# Patient Record
Sex: Male | Born: 2000
Health system: Southern US, Community
[De-identification: ages and names within clinical notes are randomized; demographics above are authoritative.]

## PROBLEM LIST (undated history)

## (undated) DIAGNOSIS — S83281A Other tear of lateral meniscus, current injury, right knee, initial encounter: Secondary | ICD-10-CM

## (undated) DIAGNOSIS — Z789 Other specified health status: Secondary | ICD-10-CM

## (undated) HISTORY — PX: NO PAST SURGERIES: SHX2092

---
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
## 2018-01-04 DIAGNOSIS — S83281A Other tear of lateral meniscus, current injury, right knee, initial encounter: Secondary | ICD-10-CM | POA: Diagnosis not present

## 2018-01-09 DIAGNOSIS — M25561 Pain in right knee: Secondary | ICD-10-CM | POA: Diagnosis not present

## 2018-01-11 DIAGNOSIS — S83281D Other tear of lateral meniscus, current injury, right knee, subsequent encounter: Secondary | ICD-10-CM | POA: Diagnosis not present

## 2018-01-12 DIAGNOSIS — S83281D Other tear of lateral meniscus, current injury, right knee, subsequent encounter: Secondary | ICD-10-CM | POA: Diagnosis not present

## 2018-01-14 ENCOUNTER — Encounter (HOSPITAL_BASED_OUTPATIENT_CLINIC_OR_DEPARTMENT_OTHER): Payer: Self-pay | Admitting: *Deleted

## 2018-01-14 ENCOUNTER — Other Ambulatory Visit: Payer: Self-pay

## 2018-01-14 DIAGNOSIS — S83281A Other tear of lateral meniscus, current injury, right knee, initial encounter: Secondary | ICD-10-CM

## 2018-01-14 HISTORY — DX: Other tear of lateral meniscus, current injury, right knee, initial encounter: S83.281A

## 2018-01-14 NOTE — H&P (Signed)
Matthew Gill is an 17 y.o. male.   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 He comes to the office with concerns about his right knee. A little over two weeks ago he injured his right knee in a game. He felt a pop and had mild swelling but he was able to finish out the remainder of the basketball season with knee discomfort. No frank instability. No previous problems with his knee. It has not improved now despite relative rest. He comes in with his mother with concerns. Health by review is otherwise unremarkable. Normal growth and development for age   Past Medical History:  Diagnosis Date  . Medical history non-contributory     Past Surgical History:  Procedure Laterality Date  . NO PAST SURGERIES      Family History  Problem Relation Age of Onset  . Sudden death Neg Hx   . Heart attack Neg Hx    Social History:  reports that he has never smoked. He has never used smokeless tobacco. He reports that he does not drink alcohol or use drugs.  Allergies: No Known Allergies  No medications prior to admission.    No results found for this or any previous visit (from the past 48 hour(s)). No results found.  Review of Systems  Constitutional: Negative.   HENT: Negative.   Eyes: Negative.   Respiratory: Negative.   Cardiovascular: Negative.   Gastrointestinal: Negative.   Genitourinary: Negative.   Musculoskeletal: Negative.   Skin: Negative.   Neurological: Negative.   Endo/Heme/Allergies: Negative.   Psychiatric/Behavioral: Negative.     Height 6' (1.829 m), weight 74.8 kg (165 lb). Physical Exam  Constitutional: He is oriented to person, place, and time. He appears well-developed and well-nourished.  HENT:  Head: Normocephalic and atraumatic.  Mouth/Throat: Oropharynx is clear and moist.  Eyes: Pupils are equal, round, and reactive to light. Conjunctivae are normal.  Neck: Neck supple.   Cardiovascular: Normal rate.  Respiratory: Effort normal.  GI: Soft.  Genitourinary:  Genitourinary Comments: Not pertinent to current symptomatology therefore not examined.  Musculoskeletal:  Examination of his right knee shows a negative Lachman. No effusion. Full motion. Discreet tenderness to the lateral joint line. Positive McMurray. Neurovascularly intact. distally.  Neurological: He is alert and oriented to person, place, and time.  Skin: Skin is warm and dry.  Psychiatric: He has a normal mood and affect.     Assessment Principal Problem:   Acute lateral meniscus tear of right knee   Plan Matthew Gill comes into the office in follow up after having an MRI of his right knee.  By review, lateral meniscus tear.  ACL is intact.  I had a long discussion with he and his mother as to these MRI findings.  I discussed with them the merits of proceeding with right knee arthroscopy, debridement versus repair, subsequent post-op care, recovery and rehab all discussed at length with them.  Surgical consultation, per their request, this week with Dr. Thurston HoleWainer.  Post-op follow up with Dr. Thurston HoleWainer.    Matthew Selvy J Qasim Diveley, PA-C 01/14/2018, 9:36 AM

## 2018-01-15 ENCOUNTER — Encounter (HOSPITAL_BASED_OUTPATIENT_CLINIC_OR_DEPARTMENT_OTHER)
Admission: RE | Disposition: A | Discharge status: Home / Self Care | Payer: Self-pay | Source: Ambulatory Visit | Attending: Orthopedic Surgery

## 2018-01-15 ENCOUNTER — Ambulatory Visit (HOSPITAL_BASED_OUTPATIENT_CLINIC_OR_DEPARTMENT_OTHER): Payer: 59 | Admitting: Anesthesiology

## 2018-01-15 ENCOUNTER — Ambulatory Visit (HOSPITAL_BASED_OUTPATIENT_CLINIC_OR_DEPARTMENT_OTHER)
Admission: RE | Admit: 2018-01-15 | Discharge: 2018-01-15 | Disposition: A | Discharge status: Home / Self Care | Payer: 59 | Source: Ambulatory Visit | Attending: Orthopedic Surgery | Admitting: Orthopedic Surgery

## 2018-01-15 ENCOUNTER — Encounter (HOSPITAL_BASED_OUTPATIENT_CLINIC_OR_DEPARTMENT_OTHER): Payer: Self-pay | Admitting: *Deleted

## 2018-01-15 DIAGNOSIS — S83281A Other tear of lateral meniscus, current injury, right knee, initial encounter: Secondary | ICD-10-CM | POA: Diagnosis not present

## 2018-01-15 DIAGNOSIS — Y9367 Activity, basketball: Secondary | ICD-10-CM | POA: Insufficient documentation

## 2018-01-15 DIAGNOSIS — G8918 Other acute postprocedural pain: Secondary | ICD-10-CM | POA: Diagnosis not present

## 2018-01-15 DIAGNOSIS — X58XXXA Exposure to other specified factors, initial encounter: Secondary | ICD-10-CM | POA: Insufficient documentation

## 2018-01-15 HISTORY — DX: Other tear of lateral meniscus, current injury, right knee, initial encounter: S83.281A

## 2018-01-15 HISTORY — DX: Other specified health status: Z78.9

## 2018-01-15 HISTORY — PX: KNEE ARTHROSCOPY WITH LATERAL MENISECTOMY: SHX6193

## 2018-01-15 SURGERY — ARTHROSCOPY, KNEE, WITH LATERAL MENISCECTOMY
Anesthesia: General | Site: Knee | Laterality: Right

## 2018-01-15 MED ORDER — FENTANYL CITRATE (PF) 100 MCG/2ML IJ SOLN
50.0000 ug | INTRAMUSCULAR | Status: DC | PRN
  Administered 2018-01-15: 100 ug via INTRAVENOUS

## 2018-01-15 MED ORDER — GLYCOPYRROLATE 0.2 MG/ML IJ SOLN
INTRAMUSCULAR | Status: DC | PRN
  Administered 2018-01-15: 0.2 mg via INTRAVENOUS

## 2018-01-15 MED ORDER — NEOSTIGMINE METHYLSULFATE 5 MG/5ML IV SOSY
PREFILLED_SYRINGE | INTRAVENOUS | Status: AC
  Filled 2018-01-15: qty 5

## 2018-01-15 MED ORDER — LIDOCAINE HCL (CARDIAC) 20 MG/ML IV SOLN
INTRAVENOUS | Status: AC
Start: 2018-01-15 — End: 2018-01-15
  Filled 2018-01-15: qty 5

## 2018-01-15 MED ORDER — ONDANSETRON HCL 4 MG/2ML IJ SOLN
INTRAMUSCULAR | Status: DC | PRN
  Administered 2018-01-15: 4 mg via INTRAVENOUS

## 2018-01-15 MED ORDER — POVIDONE-IODINE 7.5 % EX SOLN: Freq: Once | CUTANEOUS | Status: DC

## 2018-01-15 MED ORDER — CHLORHEXIDINE GLUCONATE 4 % EX LIQD: 60.0000 mL | Freq: Once | CUTANEOUS | Status: DC

## 2018-01-15 MED ORDER — ROCURONIUM BROMIDE 10 MG/ML (PF) SYRINGE
PREFILLED_SYRINGE | INTRAVENOUS | Status: AC
  Filled 2018-01-15: qty 5

## 2018-01-15 MED ORDER — SCOPOLAMINE 1 MG/3DAYS TD PT72: 1.0000 | MEDICATED_PATCH | Freq: Once | TRANSDERMAL | Status: DC | PRN

## 2018-01-15 MED ORDER — KETOROLAC TROMETHAMINE 30 MG/ML IJ SOLN: 30.0000 mg | Freq: Once | INTRAMUSCULAR | Status: DC | PRN

## 2018-01-15 MED ORDER — LACTATED RINGERS IV SOLN
INTRAVENOUS | Status: DC
  Administered 2018-01-15: 10:00:00 via INTRAVENOUS

## 2018-01-15 MED ORDER — HYDROCODONE-ACETAMINOPHEN 5-325 MG PO TABS: 1.0000 | ORAL_TABLET | ORAL | 0 refills | Status: DC | PRN

## 2018-01-15 MED ORDER — FENTANYL CITRATE (PF) 100 MCG/2ML IJ SOLN
INTRAMUSCULAR | Status: AC
  Filled 2018-01-15: qty 2

## 2018-01-15 MED ORDER — OXYCODONE HCL 5 MG/5ML PO SOLN: 5.0000 mg | Freq: Once | ORAL | Status: DC | PRN

## 2018-01-15 MED ORDER — BUPIVACAINE-EPINEPHRINE (PF) 0.25% -1:200000 IJ SOLN
INTRAMUSCULAR | Status: AC
  Filled 2018-01-15: qty 30

## 2018-01-15 MED ORDER — PROPOFOL 10 MG/ML IV BOLUS
INTRAVENOUS | Status: AC
  Filled 2018-01-15: qty 40

## 2018-01-15 MED ORDER — LIDOCAINE HCL (CARDIAC) 20 MG/ML IV SOLN
INTRAVENOUS | Status: DC | PRN
  Administered 2018-01-15: 80 mg via INTRAVENOUS

## 2018-01-15 MED ORDER — CEFAZOLIN SODIUM-DEXTROSE 2-4 GM/100ML-% IV SOLN: 2.0000 g | INTRAVENOUS | Status: DC

## 2018-01-15 MED ORDER — MIDAZOLAM HCL 2 MG/2ML IJ SOLN
INTRAMUSCULAR | Status: AC
  Filled 2018-01-15: qty 2

## 2018-01-15 MED ORDER — FENTANYL CITRATE (PF) 100 MCG/2ML IJ SOLN
INTRAMUSCULAR | Status: AC
Start: 2018-01-15 — End: 2018-01-15
  Filled 2018-01-15: qty 2

## 2018-01-15 MED ORDER — PROMETHAZINE HCL 25 MG/ML IJ SOLN: 6.2500 mg | INTRAMUSCULAR | Status: DC | PRN

## 2018-01-15 MED ORDER — BUPIVACAINE-EPINEPHRINE (PF) 0.5% -1:200000 IJ SOLN
INTRAMUSCULAR | Status: DC | PRN
  Administered 2018-01-15: 30 mL

## 2018-01-15 MED ORDER — FENTANYL CITRATE (PF) 100 MCG/2ML IJ SOLN: 25.0000 ug | INTRAMUSCULAR | Status: DC | PRN

## 2018-01-15 MED ORDER — CEFAZOLIN SODIUM-DEXTROSE 2-3 GM-%(50ML) IV SOLR
INTRAVENOUS | Status: DC | PRN
  Administered 2018-01-15: 2 g via INTRAVENOUS

## 2018-01-15 MED ORDER — OXYCODONE HCL 5 MG PO TABS: 5.0000 mg | ORAL_TABLET | Freq: Once | ORAL | Status: DC | PRN

## 2018-01-15 MED ORDER — PROPOFOL 10 MG/ML IV BOLUS
INTRAVENOUS | Status: DC | PRN
  Administered 2018-01-15: 150 mg via INTRAVENOUS
  Administered 2018-01-15: 50 mg via INTRAVENOUS

## 2018-01-15 MED ORDER — DEXAMETHASONE SODIUM PHOSPHATE 10 MG/ML IJ SOLN
INTRAMUSCULAR | Status: DC | PRN
  Administered 2018-01-15: 10 mg via INTRAVENOUS

## 2018-01-15 MED ORDER — MIDAZOLAM HCL 2 MG/2ML IJ SOLN
1.0000 mg | INTRAMUSCULAR | Status: DC | PRN
  Administered 2018-01-15: 2 mg via INTRAVENOUS

## 2018-01-15 MED ORDER — LIDOCAINE-EPINEPHRINE (PF) 1.5 %-1:200000 IJ SOLN
INTRAMUSCULAR | Status: DC | PRN
  Administered 2018-01-15: 20 mL

## 2018-01-15 MED ORDER — CEFAZOLIN SODIUM-DEXTROSE 2-4 GM/100ML-% IV SOLN
INTRAVENOUS | Status: AC
  Filled 2018-01-15: qty 100

## 2018-01-15 MED ORDER — LACTATED RINGERS IV SOLN
INTRAVENOUS | Status: DC
  Administered 2018-01-15: 09:00:00 via INTRAVENOUS

## 2018-01-15 MED FILL — HYDROCODON-APAP 5-325: 5-325 | 4 days supply | Qty: 20 | Fill #0

## 2018-01-15 SURGICAL SUPPLY — 44 items
BANDAGE ACE 6X5 VEL STRL LF (GAUZE/BANDAGES/DRESSINGS) ×2 IMPLANT
BLADE CUDA GRT WHITE 3.5 (BLADE) IMPLANT
BLADE CUTTER GATOR 3.5 (BLADE) ×2 IMPLANT
BLADE GREAT WHITE 4.2 (BLADE) IMPLANT
BLADE SURG 15 STRL LF DISP TIS (BLADE) IMPLANT
BLADE SURG 15 STRL SS (BLADE)
BNDG COHESIVE 4X5 TAN STRL (GAUZE/BANDAGES/DRESSINGS) IMPLANT
DRAPE ARTHROSCOPY W/POUCH 90 (DRAPES) ×2 IMPLANT
DURAPREP 26ML APPLICATOR (WOUND CARE) ×2 IMPLANT
GAUZE SPONGE 4X4 12PLY STRL (GAUZE/BANDAGES/DRESSINGS) ×2 IMPLANT
GAUZE XEROFORM 1X8 LF (GAUZE/BANDAGES/DRESSINGS) ×2 IMPLANT
GLOVE BIO SURGEON STRL SZ 6.5 (GLOVE) ×2 IMPLANT
GLOVE BIO SURGEON STRL SZ7 (GLOVE) ×2 IMPLANT
GLOVE BIOGEL PI IND STRL 7.0 (GLOVE) ×3 IMPLANT
GLOVE BIOGEL PI IND STRL 7.5 (GLOVE) ×1 IMPLANT
GLOVE BIOGEL PI INDICATOR 7.0 (GLOVE) ×3
GLOVE BIOGEL PI INDICATOR 7.5 (GLOVE) ×1
GLOVE SS BIOGEL STRL SZ 7.5 (GLOVE) ×1 IMPLANT
GLOVE SUPERSENSE BIOGEL SZ 7.5 (GLOVE) ×1
GOWN STRL REUS W/ TWL LRG LVL3 (GOWN DISPOSABLE) ×2 IMPLANT
GOWN STRL REUS W/ TWL XL LVL3 (GOWN DISPOSABLE) ×1 IMPLANT
GOWN STRL REUS W/TWL LRG LVL3 (GOWN DISPOSABLE) ×2
GOWN STRL REUS W/TWL XL LVL3 (GOWN DISPOSABLE) ×1
HOLDER KNEE FOAM BLUE (MISCELLANEOUS) ×2 IMPLANT
KNEE WRAP E Z 3 GEL PACK (MISCELLANEOUS) ×2 IMPLANT
MANIFOLD NEPTUNE II (INSTRUMENTS) ×2 IMPLANT
NDL SAFETY ECLIPSE 18X1.5 (NEEDLE) ×1 IMPLANT
NEEDLE HYPO 18GX1.5 SHARP (NEEDLE) ×1
NEEDLE HYPO 22GX1.5 SAFETY (NEEDLE) IMPLANT
PACK ARTHROSCOPY DSU (CUSTOM PROCEDURE TRAY) ×2 IMPLANT
PACK BASIN DAY SURGERY FS (CUSTOM PROCEDURE TRAY) ×2 IMPLANT
PAD ALCOHOL SWAB (MISCELLANEOUS) IMPLANT
SUCTION FRAZIER HANDLE 10FR (MISCELLANEOUS)
SUCTION TUBE FRAZIER 10FR DISP (MISCELLANEOUS) IMPLANT
SUT ETHILON 4 0 PS 2 18 (SUTURE) ×2 IMPLANT
SUT PROLENE 3 0 PS 2 (SUTURE) IMPLANT
SUT VIC AB 3-0 PS1 18 (SUTURE)
SUT VIC AB 3-0 PS1 18XBRD (SUTURE) IMPLANT
SYR 20CC LL (SYRINGE) IMPLANT
SYR 5ML LL (SYRINGE) ×2 IMPLANT
TOWEL OR 17X24 6PK STRL BLUE (TOWEL DISPOSABLE) ×2 IMPLANT
TUBING ARTHRO INFLOW-ONLY STRL (TUBING) ×2 IMPLANT
WAND STAR VAC 90 (SURGICAL WAND) IMPLANT
WATER STERILE IRR 1000ML POUR (IV SOLUTION) ×2 IMPLANT

## 2018-01-15 NOTE — Anesthesia Postprocedure Evaluation (Signed)
Anesthesia Post Note  Patient: Matthew Gill  Procedure(s) Performed: RIGHT KNEE ARTHROSCOPY WITH PARTIAL LATERAL MENISECTOMY (Right Knee)     Patient location during evaluation: PACU Anesthesia Type: General Level of consciousness: awake and alert Pain management: pain level controlled Vital Signs Assessment: post-procedure vital signs reviewed and stable Respiratory status: spontaneous breathing, nonlabored ventilation, respiratory function stable and patient connected to nasal cannula oxygen Cardiovascular status: blood pressure returned to baseline and stable Postop Assessment: no apparent nausea or vomiting Anesthetic complications: no    Last Vitals:  Vitals:   01/15/18 1130 01/15/18 1200  BP: (!) 107/58 107/67  Pulse: (!) 52 (!) 101  Resp: 12 16  Temp:  36.7 C  SpO2: 100% 97%    Last Pain:  Vitals:   01/15/18 1200  TempSrc:   PainSc: 0-No pain                 Alverta Caccamo S

## 2018-01-15 NOTE — Interval H&P Note (Signed)
History and Physical Interval Note:  01/15/2018 9:49 AM  Matthew Gill  has presented today for surgery, with the diagnosis of RIGHT KNEE TEAR OF LATERAL MENISCUS  The various methods of treatment have been discussed with the patient and family. After consideration of risks, benefits and other options for treatment, the patient has consented to  Procedure(s): RIGHT KNEE ARTHROSCOPY WITH LATERAL MENISECTOMY VERSES REPAIR,CLAIRE WITH LINVATEC POSSIBLE LATERAL MENISCUS REPAIR (Right) as a surgical intervention .  The patient's history has been reviewed, patient examined, no change in status, stable for surgery.  I have reviewed the patient's chart and labs.  Questions were answered to the patient's satisfaction.     Nilda Simmerobert A Shanah Guimaraes

## 2018-01-15 NOTE — Op Note (Signed)
NAMOletta Lamas:  Botting, Avanish                ACCOUNT NO.:  [id number]  MEDICAL RECORD NO.:  [id number]  LOCATION:                                 FACILITY:  PHYSICIAN:  Elana Almobert A. Thurston HoleWainer, M.D.      DATE OF BIRTH:  DATE OF PROCEDURE:  01/15/2018 DATE OF DISCHARGE:                              OPERATIVE REPORT   PREOPERATIVE DIAGNOSIS:  Right knee acute traumatic lateral meniscus tear.  POSTOPERATIVE DIAGNOSIS:  Right knee acute traumatic lateral meniscus tear.  PROCEDURE:  Right knee EUA, followed by arthroscopic partial lateral meniscectomy.  SURGEON:  Elana Almobert A. Thurston HoleWainer, M.D.  ASSISTANT:  Kirstin Shepperson, PA-C.  ANESTHESIA:  General.  OPERATIVE TIME:  40 minutes.  COMPLICATIONS:  None.  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  Exam and MRIs revealed a lateral meniscus tear.  He has failed conservative care and is now to undergo arthroscopy.  DESCRIPTION:  Matthew Gill was brought in the operating room on January 15, 2018 after knee block was placed in the holding room by Anesthesia.  He was placed on operative table in supine position.  He received antibiotics preoperatively for prophylaxis.  After being placed under general anesthesia, his right knee was examined.  He had full range of motion. Knee was stable ligamentous exam with normal patellar tracking.  Right leg was prepped using sterile DuraPrep and draped using sterile technique.  Time-out procedure was called and the correct right knee identified.  Initially, through an anterolateral portal, the arthroscope with a pump attached was placed into an anteromedial portal and arthroscopic probe was placed.  On initial inspection of medial compartment, the articular cartilage was normal.  Medial meniscus was normal.  Intercondylar notch inspected.  Anterior cruciate ligament appeared normal with 2-3 mm of laxity with  a firm endpoint.  PCL was intact and stable.  Lateral compartment was inspected.  The articular cartilage showed a small grade 3 chondral injury on the lateral femoral condyle, which was debrided.  The rest of the lateral compartment was intact.  Lateral meniscus showed a split tear in the posterolateral corner, which was non-repairable.  This split went all the way to the periphery and the posterior and anterior components of this tear were carefully debrided back to a stable rim.  Approximately 30% of the posterolateral corner was resected back to a stable rim.  The rest of meniscus was intact.  Patellofemoral joint articular cartilage was intact.  The patella tracked normally.  Medial and lateral gutters were free of pathology.  At this point, felt that all pathology have been satisfactorily addressed.  The instruments were removed.  Portals closed with 3-0 nylon suture.  Sterile dressings were applied.  The patient awakened, taken to recovery room in stable condition.  FOLLOWUP CARE:  Matthew Gill will be followed as an outpatient on Norco for pain.  Seen back in office in a week for sutures out and followup.     Matthew Gill A. Thurston HoleWainer, M.D.   ______________________________ Elana Almobert A. Thurston HoleWainer, M.D.    RAW/MEDQ  D:  01/15/2018  T:  01/15/2018  Job:  [id number]

## 2018-01-15 NOTE — Anesthesia Procedure Notes (Signed)
Procedure Name: LMA Insertion Performed by: Damek Ende M, CRNA Pre-anesthesia Checklist: Patient identified, Emergency Drugs available, Suction available, Patient being monitored and Timeout performed Patient Re-evaluated:Patient Re-evaluated prior to induction Oxygen Delivery Method: Circle system utilized Preoxygenation: Pre-oxygenation with 100% oxygen Induction Type: IV induction Ventilation: Mask ventilation without difficulty LMA: LMA inserted LMA Size: 4.0 Tube type: Oral Number of attempts: 1 Placement Confirmation: positive ETCO2,  CO2 detector and breath sounds checked- equal and bilateral Tube secured with: Tape Dental Injury: Teeth and Oropharynx as per pre-operative assessment        

## 2018-01-15 NOTE — Anesthesia Preprocedure Evaluation (Signed)
Anesthesia Evaluation  Patient identified by MRN, date of birth, ID band Patient awake    Reviewed: Allergy & Precautions, NPO status , Patient's Chart, lab work & pertinent test results  Airway Mallampati: II  TM Distance: >3 FB Neck ROM: Full    Dental no notable dental hx.    Pulmonary neg pulmonary ROS,    Pulmonary exam normal breath sounds clear to auscultation       Cardiovascular negative cardio ROS Normal cardiovascular exam Rhythm:Regular Rate:Normal     Neuro/Psych negative neurological ROS  negative psych ROS   GI/Hepatic negative GI ROS, Neg liver ROS,   Endo/Other  negative endocrine ROS  Renal/GU negative Renal ROS  negative genitourinary   Musculoskeletal negative musculoskeletal ROS (+)   Abdominal   Peds negative pediatric ROS (+)  Hematology negative hematology ROS (+)   Anesthesia Other Findings   Reproductive/Obstetrics negative OB ROS                             Anesthesia Physical Anesthesia Plan  ASA: I  Anesthesia Plan: General   Post-op Pain Management:    Induction: Intravenous  PONV Risk Score and Plan: 2 and Ondansetron, Dexamethasone and Treatment may vary due to age or medical condition  Airway Management Planned: LMA  Additional Equipment:   Intra-op Plan:   Post-operative Plan: Extubation in OR  Informed Consent: I have reviewed the patients History and Physical, chart, labs and discussed the procedure including the risks, benefits and alternatives for the proposed anesthesia with the patient or authorized representative who has indicated his/her understanding and acceptance.     Dental advisory given  Plan Discussed with: CRNA and Surgeon  Anesthesia Plan Comments:         Anesthesia Quick Evaluation  

## 2018-01-15 NOTE — Transfer of Care (Signed)
Immediate Anesthesia Transfer of Care Note  Patient: Matthew Gill  Procedure(s) Performed: RIGHT KNEE ARTHROSCOPY WITH PARTIAL LATERAL MENISECTOMY (Right Knee)  Patient Location: PACU  Anesthesia Type:General  Level of Consciousness: awake, alert  and oriented  Airway & Oxygen Therapy: Patient Spontanous Breathing and Patient connected to face mask oxygen  Post-op Assessment: Report given to RN and Post -op Vital signs reviewed and stable  Post vital signs: Reviewed and stable  Last Vitals:  Vitals Value Taken Time  BP 90/39 01/15/2018 10:38 AM  Temp    Pulse 72 01/15/2018 10:40 AM  Resp 15 01/15/2018 10:40 AM  SpO2 100 % 01/15/2018 10:40 AM  Vitals shown include unvalidated device data.  Last Pain:  Vitals:   01/15/18 0854  TempSrc: Oral  PainSc: 6          Complications: No apparent anesthesia complications

## 2018-01-15 NOTE — Discharge Instructions (Signed)

## 2018-01-15 NOTE — Anesthesia Procedure Notes (Signed)
Anesthesia Regional Block: Knee block   Pre-Anesthetic Checklist: ,, timeout performed, Correct Patient, Correct Site, Correct Laterality, Correct Procedure, Correct Position, site marked, Risks and benefits discussed,  Surgical consent,  Pre-op evaluation,  At surgeon's request and post-op pain management  Laterality: Right  Prep: chloraprep       Needles:  Injection technique: Single-shot  Needle Type: Other      Needle Gauge: 18     Additional Needles:   Narrative:  Start time: 01/15/2018 9:45 AM End time: 01/15/2018 9:52 AM Injection made incrementally with aspirations every 5 mL.  Performed by: Personally  Anesthesiologist: Eilene Ghaziose, Jersi Mcmaster, MD  Additional Notes: Patient tolerated the procedure well without complications  Intra-articular knee block with 30 cc 0.5% bupiv, and 20cc 1.5%lido with epi 1:200K

## 2018-01-18 ENCOUNTER — Encounter (HOSPITAL_BASED_OUTPATIENT_CLINIC_OR_DEPARTMENT_OTHER): Payer: Self-pay | Admitting: Orthopedic Surgery

## 2018-01-25 DIAGNOSIS — S83281D Other tear of lateral meniscus, current injury, right knee, subsequent encounter: Secondary | ICD-10-CM | POA: Diagnosis not present

## 2018-02-08 ENCOUNTER — Other Ambulatory Visit: Payer: Self-pay

## 2018-02-08 ENCOUNTER — Encounter: Payer: Self-pay | Admitting: Physical Therapy

## 2018-02-08 ENCOUNTER — Ambulatory Visit: Payer: 59 | Attending: Orthopedic Surgery | Admitting: Physical Therapy

## 2018-02-08 DIAGNOSIS — M6281 Muscle weakness (generalized): Secondary | ICD-10-CM | POA: Insufficient documentation

## 2018-02-08 DIAGNOSIS — R29898 Other symptoms and signs involving the musculoskeletal system: Secondary | ICD-10-CM | POA: Diagnosis not present

## 2018-02-08 DIAGNOSIS — M25561 Pain in right knee: Secondary | ICD-10-CM | POA: Insufficient documentation

## 2018-02-08 NOTE — Therapy (Signed)
[facility] [address]  [address] [place], [place], [address] Phone: [phone]   Fax:  [phone]  Physical Therapy Evaluation  Patient Details  Name: Matthew Gill MRN: [id number] Date of Birth: 11/12/2000 Referring Provider: Dr. Thurston Hole   Encounter Date: 02/08/2018  PT End of Session - 02/08/18 0925    Visit Number  1    Number of Visits  12    Date for PT Re-Evaluation  03/22/18    PT Start Time  0839    PT Stop Time  0920    PT Time Calculation (min)  41 min    Activity Tolerance  Patient tolerated treatment well    Behavior During Therapy  [facility] for tasks assessed/performed       Past Medical History:  Diagnosis Date  . Acute lateral meniscus tear of right knee 01/14/2018  . Medical history non-contributory     Past Surgical History:  Procedure Laterality Date  . KNEE ARTHROSCOPY WITH LATERAL MENISECTOMY Right 01/15/2018   Procedure: RIGHT KNEE ARTHROSCOPY WITH PARTIAL LATERAL MENISECTOMY;  Surgeon: Salvatore Marvel, MD;  Location: [facility];  Service: Orthopedics;  Laterality: Right;  . NO PAST SURGERIES      There were no vitals filed for this visit.   Subjective Assessment - 02/08/18 0840    Subjective  patient reporting pain and swelling after basketball. Had menisectomy - feeling better. No running, jumping, squatting for 2 months. Going to [facility] in the fall to play basketball.     Patient is accompained by:  Family member    Diagnostic tests  MRI: R lateral meniscus tear    Patient Stated Goals  return to playing ball    Currently in Pain?  No/denies         [facility] PT Assessment - 02/08/18 0842      Assessment   Medical Diagnosis  R knee scope + partial lateral meniscectomy    Referring Provider  Dr. Thurston Hole    Onset Date/Surgical Date  01/15/18    Next MD Visit  02/15/18    Prior Therapy  no      Precautions   Precautions  None      Restrictions   Weight Bearing Restrictions  No       Balance Screen   Has the patient fallen in the past 6 months  No    Has the patient had a decrease in activity level because of a fear of falling?   No    Is the patient reluctant to leave their home because of a fear of falling?   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      Cognition   Overall Cognitive Status  Within Functional Limits for tasks assessed      Sensation   Light Touch  Appears Intact      Coordination   Gross Motor Movements are Fluid and Coordinated  Yes      Posture/Postural Control   Posture/Postural Control  Postural limitations    Postural Limitations  Rounded Shoulders;Forward head      ROM / Strength   AROM / PROM / Strength  AROM;Strength      AROM   AROM Assessment Site  Knee    Right/Left Knee  Right    Right Knee Extension  -1    Right Knee Flexion  130      Strength   Strength Assessment Site  Hip;Knee    Right/Left Hip  Right    Right Hip Flexion  4/5    Right Hip ABduction  4-/5    Right/Left Knee  Right    Right Knee Flexion  4/5    Right Knee Extension  4+/5      Flexibility   Soft Tissue Assessment /Muscle Length  yes    Hamstrings  R hamstring moderate tightness      Palpation   Patella mobility  good mobility in all planes    Palpation comment  diffusely non-tender                Objective measurements completed on examination: See above findings.      [facility] Adult PT Treatment/Exercise - 02/08/18 0842      Exercises   Exercises  Knee/Hip      Knee/Hip Exercises: Stretches   Passive Hamstring Stretch  Right;3 reps;30 seconds    Gastroc Stretch  Right;3 reps;30 seconds      Knee/Hip Exercises: Aerobic   Stationary Bike  L1 x 5 min - some onset of anterior  knee pain      Knee/Hip Exercises: Supine   Single Leg Bridge  Right;10 reps    Straight Leg Raises  Right;10 reps    Straight Leg Raise with External Rotation  Right;10 reps      Knee/Hip Exercises: Sidelying   Hip ABduction  Right;10 reps      Knee/Hip Exercises: Prone   Hamstring Curl  10 reps;5 seconds    Hamstring Curl Limitations  eccentric lowering    Hip Extension  Right;10 reps             PT Education - 02/08/18 0924    Education provided  Yes    Education Details  exam findings, POC, HEP    Person(s) Educated  Patient    Methods  Explanation;Demonstration;Handout    Comprehension  Verbalized understanding;Returned demonstration          PT Long Term Goals - 02/08/18 1023      PT LONG TERM GOAL #1   Title  patient to be independent with advanced HEP    Status  New    Target Date  03/22/18      PT LONG TERM GOAL #2   Title  patient to improve R hip and knee strength to 5/5 for improved function    Status  New    Target Date  03/22/18      PT LONG TERM GOAL #3   Title  patient to demonstrate eccentric step down of R LE without instability or pain    Status  New    Target Date  03/22/18      PT LONG TERM GOAL #4   Title  patient to demonstrate good running/jumping/landing mechanics without instability - as allowed per MD    Status  New    Target Date  03/22/18             Plan - 02/08/18 0925    Clinical Impression Statement  Laurin is an 17 y/o male presenting to OPPT today s/p R knee scope + partial lateral meniscectomy on 01/15/18. 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 Patient today with good gait mechanics and AROM - primary deficits seen in strength at R hip and knee. Patient given initial HEP for gentle stretching and strengthening with good tolerance and carryover. Patient to benefit from skilled PT intervention to address strength deficits and to progress towards return to sport for reduced risk of re-injury.      Clinical Presentation  Stable    Clinical Decision Making  Low    Rehab Potential  Excellent    PT Frequency  2x / week    PT Duration  6 weeks    PT Treatment/Interventions  ADLs/Self Care Home Management;Cryotherapy;Electrical Stimulation;Iontophoresis /ml Dexamethasone;Moist Heat;Therapeutic exercise;Therapeutic activities;Functional mobility training;Balance training;Neuromuscular re-education;Patient/family education;Manual techniques;Vasopneumatic Device;Taping;Dry needling;Passive range of motion    PT Next Visit Plan  SLOW progress rehab - no running, jumping, squatting for 2 months - per MD    Consulted and Agree with Plan of Care  Patient;Family member/caregiver    Family Member Consulted  mother       Patient will benefit from skilled therapeutic intervention in order to improve the following deficits and impairments:  Pain, Decreased strength, Decreased mobility, Decreased activity tolerance  Visit Diagnosis: Acute pain of right knee  Other symptoms and signs involving the musculoskeletal system  Muscle weakness (generalized)     Problem List Patient Active Problem List   Diagnosis Date Noted  . Acute lateral meniscus tear of right knee 01/14/2018  . Sports physical 06/10/2017    Kipp Laurence, PT, DPT 02/08/18 10:26 AM   [facility] [address]  [address] [place], [place], [address] Phone: [phone]   Fax:  [phone]  Name: MANRAJ YEO MRN: [id number] Date of Birth: 07/31/2001

## 2018-02-08 NOTE — Patient Instructions (Addendum)
Hamstring Step 2   Left foot relaxed, knee straight, other leg bent, foot flat. Raise straight leg further upward to maximal range. Hold _30__ seconds. Relax leg completely down. Repeat _3__ times.  Strengthening: Straight Leg Raise (Phase 1)   Tighten muscles on front of right thigh, then lift leg _~8___ inches from surface, keeping knee locked.  Repeat __15__ times per set. Do ___2_ sets per session.   Straight Leg Raise: With External Leg Rotation   Lie on back with right leg straight, opposite leg bent. Rotate straight leg out and lift ___~8_ inches. Repeat __15__ times per set. Do __2__ sets per session.   Bridging (Single Leg)   Lie on back with feet shoulder width apart and left leg straight. Lift hips toward the ceiling while keeping leg straight. Hold __2-3__ seconds. Repeat _15___ times. Do __2__ sessions per day.  ABDUCTION: Side-Lying (Active)   Lie on left side, top leg straight. Raise top leg as far as possible. Use _0__ lbs. Complete 2___ sets of _15__ repetitions.   HIP: Extension / KNEE: Flexion - Prone   Bend knee, squeeze glutes. Raise leg up. _15__ reps per set, _2__ sets per day  ANKLE: Dorsiflexion, Step Unilateral   Stand on step, hang one heel off back of step. Hold _30__ seconds. _3__ reps per set, __2_ sets per day

## 2018-02-10 ENCOUNTER — Ambulatory Visit: Payer: 59 | Attending: Orthopedic Surgery | Admitting: Physical Therapy

## 2018-02-10 ENCOUNTER — Encounter: Payer: Self-pay | Admitting: Physical Therapy

## 2018-02-10 DIAGNOSIS — M25561 Pain in right knee: Secondary | ICD-10-CM | POA: Insufficient documentation

## 2018-02-10 DIAGNOSIS — M6281 Muscle weakness (generalized): Secondary | ICD-10-CM | POA: Insufficient documentation

## 2018-02-10 DIAGNOSIS — R29898 Other symptoms and signs involving the musculoskeletal system: Secondary | ICD-10-CM | POA: Insufficient documentation

## 2018-02-10 NOTE — Therapy (Signed)
[facility] [address]  [address] [place], [place], [address] Phone: [phone]   Fax:  [phone]  Physical Therapy Treatment  Patient Details  Name: Matthew Gill MRN: [id number] Date of Birth: 12/18/2000 Referring Provider: Dr. Thurston Gill   Encounter Date: 02/10/2018  PT End of Session - 02/10/18 0809    Visit Number  2    Number of Visits  12    Date for PT Re-Evaluation  03/22/18    PT Start Time  0808    PT Stop Time  0850    PT Time Calculation (min)  42 min    Activity Tolerance  Patient tolerated treatment well    Behavior During Therapy  [facility] for tasks assessed/performed       Past Medical History:  Diagnosis Date  . Acute lateral meniscus tear of right knee 01/14/2018  . Medical history non-contributory     Past Surgical History:  Procedure Laterality Date  . KNEE ARTHROSCOPY WITH LATERAL MENISECTOMY Right 01/15/2018   Procedure: RIGHT KNEE ARTHROSCOPY WITH PARTIAL LATERAL MENISECTOMY;  Surgeon: Matthew Marvel, MD;  Location: [facility];  Service: Orthopedics;  Laterality: Right;  . NO PAST SURGERIES      There were no vitals filed for this visit.  Subjective Assessment - 02/10/18 0809    Subjective  doing well - has been doing HEP without pain    Diagnostic tests  MRI: R lateral meniscus tear    Patient Stated Goals  return to playing ball    Currently in Pain?  No/denies                       [facility] Adult PT Treatment/Exercise - 02/10/18 0001      Knee/Hip Exercises: Stretches   Other Knee/Hip Stretches  foam roller to B quad - 3 way x 1 min each      Knee/Hip Exercises: Aerobic   Stationary Bike  L2 x 6 min      Knee/Hip Exercises: Machines for Strengthening   Cybex Knee Flexion  B LE - 20# x 10; B con/R ecc - 15# x 10      Knee/Hip Exercises: Standing   Hip Flexion  Stengthening;Right;15 reps;Knee straight;Limitations    Hip Flexion Limitations  red tband - 1  pole A    Hip Abduction  Stengthening;Right;15 reps;Knee straight;Limitations    Abduction Limitations  red tband - 1 pole A    Hip Extension  Stengthening;Right;15 reps;Knee straight    Extension Limitations  red tband - 1 pole A    Step Down  Right;2 sets;15 reps;Hand Hold: 2;Step Height: 6"    Other Standing Knee Exercises  B side stepping - red tband at forefoot x 30 feet each direction       Knee/Hip Exercises: Seated   Other Seated Knee/Hip Exercises  R LE - fitter - 1 blue/1black x 15      Knee/Hip Exercises: Supine   Bridges with [name]  Strengthening;15 reps + R LAQ    Straight Leg Raises  Strengthening;Right;15 reps 2#    Straight Leg Raise with External Rotation  Strengthening;Right;15 reps 2#    Other Supine Knee/Hip Exercises  HS bridge + HS curl x 15 reps      Knee/Hip Exercises: Sidelying   Hip ABduction  Strengthening;Right;15 reps 2#                  PT Long Term  Goals - 02/10/18 0809      PT LONG TERM GOAL #1   Title  patient to be independent with advanced HEP    Status  On-going      PT LONG TERM GOAL #2   Title  patient to improve R hip and knee strength to 5/5 for improved function    Status  On-going      PT LONG TERM GOAL #3   Title  patient to demonstrate eccentric step down of R LE without instability or pain    Status  On-going      PT LONG TERM GOAL #4   Title  patient to demonstrate good running/jumping/landing mechanics without instability - as allowed per MD    Status  On-going            Plan - 02/10/18 0810    Clinical Impression Statement  Matthew Gill doing well - reports good compliance with initial HEP. Patient today tolerable to all strengthening based activities without. Keeping session relatively low level per MD request. Does demonstrate expected fatigue after each set/activity, and without pain. Updated HEP to include foam rolling.     PT Treatment/Interventions  ADLs/Self Care Home Management;Cryotherapy;Electrical  Stimulation;Iontophoresis /ml Dexamethasone;Moist Heat;Therapeutic exercise;Therapeutic activities;Functional mobility training;Balance training;Neuromuscular re-education;Patient/family education;Manual techniques;Vasopneumatic Device;Taping;Dry needling;Passive range of motion    PT Next Visit Plan  SLOW progress rehab - no running, jumping, squatting for 2 months - per MD    Consulted and Agree with Plan of Care  Patient       Patient will benefit from skilled therapeutic intervention in order to improve the following deficits and impairments:  Pain, Decreased strength, Decreased mobility, Decreased activity tolerance  Visit Diagnosis: Acute pain of right knee  Other symptoms and signs involving the musculoskeletal system  Muscle weakness (generalized)     Problem List Patient Active Problem List   Diagnosis Date Noted  . Acute lateral meniscus tear of right knee 01/14/2018  . Sports physical 06/10/2017     Kipp Laurence, PT, DPT 02/10/18 8:55 AM   [facility] [address]  [address] [place], [place], [address] Phone: [phone]   Fax:  [phone]  Name: Matthew Gill MRN: [id number] Date of Birth: [birth year]

## 2018-02-15 ENCOUNTER — Ambulatory Visit: Payer: 59

## 2018-02-15 DIAGNOSIS — R29898 Other symptoms and signs involving the musculoskeletal system: Secondary | ICD-10-CM

## 2018-02-15 DIAGNOSIS — S83281D Other tear of lateral meniscus, current injury, right knee, subsequent encounter: Secondary | ICD-10-CM | POA: Diagnosis not present

## 2018-02-15 DIAGNOSIS — M6281 Muscle weakness (generalized): Secondary | ICD-10-CM | POA: Diagnosis not present

## 2018-02-15 DIAGNOSIS — M25561 Pain in right knee: Secondary | ICD-10-CM

## 2018-02-15 NOTE — Therapy (Signed)
[facility] [address]  [place], [place], [address] Phone: [phone]   Fax:  [phone]  Physical Therapy Treatment  Patient Details  Name: Matthew Gill MRN: [id number] Date of Birth: 2000/12/26 Referring Provider: Dr. Noemi Chapel   Encounter Date: 02/15/2018  PT End of Session - 02/15/18 0817    Visit Number  3    Number of Visits  12    Date for PT Re-Evaluation  03/22/18    PT Start Time  0814    PT Stop Time  0855    PT Time Calculation (min)  41 min    Activity Tolerance  Patient tolerated treatment well    Behavior During Therapy  [facility] for tasks assessed/performed       Past Medical History:  Diagnosis Date  . Acute lateral meniscus tear of right knee 01/14/2018  . Medical history non-contributory     Past Surgical History:  Procedure Laterality Date  . KNEE ARTHROSCOPY WITH LATERAL MENISECTOMY Right 01/15/2018   Procedure: RIGHT KNEE ARTHROSCOPY WITH PARTIAL LATERAL MENISECTOMY;  Surgeon: Elsie Saas, MD;  Location: [facility];  Service: Orthopedics;  Laterality: Right;  . NO PAST SURGERIES      There were no vitals filed for this visit.  Subjective Assessment - 02/15/18 0816    Subjective  Pt. doing well today with no new complaints.      Diagnostic tests  MRI: R lateral meniscus tear    Patient Stated Goals  return to playing ball    Currently in Pain?  No/denies    Multiple Pain Sites  No         [facility] PT Assessment - 02/15/18 0850      AROM   Right/Left Knee  Right    Right Knee Extension  -1    Right Knee Flexion  134      Strength   Right/Left Hip  Right    Right Hip Flexion  4/5    Right Hip ABduction  4/5    Right/Left Knee  Right    Right Knee Flexion  4/5    Right Knee Extension  4+/5                   [facility] Adult PT Treatment/Exercise - 02/15/18 0823      Knee/Hip Exercises: Aerobic   Stationary Bike  L2 x 6 min      Knee/Hip Exercises:  Standing   Heel Raises  20 reps;3 seconds    Heel Raises Limitations  counter     Knee Flexion  Right;15 reps    Knee Flexion Limitations  2#    Terminal Knee Extension  Right;15 reps;Strengthening;Theraband    Theraband Level (Terminal Knee Extension)  Level 4 (Blue)    Forward Step Up  Right;Hand Hold: 0;1 set x 12 reps; red TB TKE     Forward Step Up Limitations  Focusing on eccentric step-back     Step Down  Right;2 sets;15 reps;Hand Hold: 2;Step Height: 6" Required cueing for wt. through R heel and upright posture     Other Standing Knee Exercises  B side stepping, monster walk - red tband at ankle x 40 feet each direction  Some cueing required for upright posture and pacing       Knee/Hip Exercises: Supine   Single Leg Bridge  Right x 12 reps; 3" hold     Other Supine Knee/Hip Exercises  HS bridge +  HS curl x 20 reps             PT Education - 02/15/18 0919    Education provided  Yes    Education Details  HEP update     Person(s) Educated  Patient    Methods  Explanation;Demonstration;Verbal cues;Handout    Comprehension  Verbalized understanding;Returned demonstration;Verbal cues required;Need further instruction          PT Long Term Goals - 02/15/18 0849      PT LONG TERM GOAL #1   Title  patient to be independent with advanced HEP    Status  On-going      PT LONG TERM GOAL #2   Title  patient to improve R hip and knee strength to 5/5 for improved function    Status  On-going Still grossly 4/5 - 4+/5 at R LE       PT LONG TERM GOAL #3   Title  patient to demonstrate eccentric step down of R LE without instability or pain    Status  Partially Met Pain free however instability at R quad noted on eccentric step-down however pain free      PT LONG TERM GOAL #4   Title  patient to demonstrate good running/jumping/landing mechanics without instability - as allowed per MD    Status  On-going            Plan - 02/15/18 0817    Clinical Impression  Statement  Pt. doing well today reporting he felt fine following last visit.  Tolerated gentle progression of LE strengthening activities well today however did require cueing for proper positioning and upright posture with sidestepping and eccentric step-down.  Able to demo some improvement in R LE strength with MMT today and slight improvement in AROM flexion.  Pt. pain free with all activities today however verbalized LE fatigue following session.  Pt. to see MD for f/u later today.  Will continue to progress toward goals.      PT Treatment/Interventions  ADLs/Self Care Home Management;Cryotherapy;Electrical Stimulation;Iontophoresis 48m/ml Dexamethasone;Moist Heat;Therapeutic exercise;Therapeutic activities;Functional mobility training;Balance training;Neuromuscular re-education;Patient/family education;Manual techniques;Vasopneumatic Device;Taping;Dry needling;Passive range of motion    PT Next Visit Plan  SLOW progress rehab - no running, jumping, squatting for 2 months - per MD    Consulted and Agree with Plan of Care  Patient       Patient will benefit from skilled therapeutic intervention in order to improve the following deficits and impairments:  Pain, Decreased strength, Decreased mobility, Decreased activity tolerance  Visit Diagnosis: Acute pain of right knee  Other symptoms and signs involving the musculoskeletal system  Muscle weakness (generalized)     Problem List Patient Active Problem List   Diagnosis Date Noted  . Acute lateral meniscus tear of right knee 01/14/2018  . Sports physical 06/10/2017    MBess Harvest PTA 02/15/18 9:22 AM   [facility] [address] [place] [address]Phone: [phone]  Fax:  [phone] Name: Matthew TOLEDOMRN: [id number] of Birth: [birth year]

## 2018-02-17 ENCOUNTER — Ambulatory Visit: Payer: 59

## 2018-02-17 DIAGNOSIS — M6281 Muscle weakness (generalized): Secondary | ICD-10-CM | POA: Diagnosis not present

## 2018-02-17 DIAGNOSIS — R29898 Other symptoms and signs involving the musculoskeletal system: Secondary | ICD-10-CM | POA: Diagnosis not present

## 2018-02-17 DIAGNOSIS — M25561 Pain in right knee: Secondary | ICD-10-CM

## 2018-02-17 NOTE — Therapy (Addendum)
[facility] [address]  [place], [place], [address] Phone: [phone]   Fax:  [phone]  Physical Therapy Treatment  Patient Details  Name: Matthew Gill MRN: [id number] Date of Birth: 2001-07-18 Referring Provider: Dr. Noemi Chapel   Encounter Date: 02/17/2018  PT End of Session - 02/17/18 0811    Visit Number  4    Number of Visits  12    Date for PT Re-Evaluation  03/22/18    PT Start Time  0809    PT Stop Time  0844    PT Time Calculation (min)  35 min    Activity Tolerance  Patient tolerated treatment well    Behavior During Therapy  [facility] for tasks assessed/performed       Past Medical History:  Diagnosis Date  . Acute lateral meniscus tear of right knee 01/14/2018  . Medical history non-contributory     Past Surgical History:  Procedure Laterality Date  . KNEE ARTHROSCOPY WITH LATERAL MENISECTOMY Right 01/15/2018   Procedure: RIGHT KNEE ARTHROSCOPY WITH PARTIAL LATERAL MENISECTOMY;  Surgeon: Elsie Saas, MD;  Location: [place];  Service: Orthopedics;  Laterality: Right;  . NO PAST SURGERIES      There were no vitals filed for this visit.  Subjective Assessment - 02/17/18 0810    Subjective  Pt. noting MD wanting increased focus on quad strengthening and home strengthening program from recent f/u.      Diagnostic tests  MRI: R lateral meniscus tear    Patient Stated Goals  return to playing ball    Currently in Pain?  No/denies    Multiple Pain Sites  No                       [facility] Adult PT Treatment/Exercise - 02/17/18 0813      Knee/Hip Exercises: Aerobic   Stationary Bike  L2 x 6 min      Knee/Hip Exercises: Standing   Forward Step Up  Step Height: 8";Hand Hold: 0;15 reps;3 sets;Right green TB TKE; 1 set - varus, neutral, valgus    Forward Step Up Limitations  Focusing on eccentric step-back     Step Down  Right;2 sets;15 reps;Hand Hold: 2;Step Height: 6"     SLS  R SLS with opposite LE toe-taps to cones 2 x 30 sec     SLS with Vectors  R RDL with UE reach to high machine seat 2 x 5 rpes  only requiring minor cueing for proper technique       Knee/Hip Exercises: Supine   Straight Leg Raises  Strengthening;Right;15 reps 3#    Straight Leg Raise with External Rotation  Strengthening;Right;15 reps 3#             PT Education - 02/17/18 1252    Education provided  Yes    Education Details  HEP update     Person(s) Educated  Patient    Methods  Explanation;Demonstration;Verbal cues;Handout    Comprehension  Verbalized understanding;Returned demonstration;Verbal cues required;Need further instruction          PT Long Term Goals - 02/15/18 0849      PT LONG TERM GOAL #1   Title  patient to be independent with advanced HEP    Status  On-going      PT LONG TERM GOAL #2   Title  patient to improve R hip and knee strength to 5/5 for improved function  Status  On-going Still grossly 4/5 - 4+/5 at R LE       PT LONG TERM GOAL #3   Title  patient to demonstrate eccentric step down of R LE without instability or pain    Status  Partially Met Pain free however instability at R quad noted on eccentric step-down however pain free      PT LONG TERM GOAL #4   Title  patient to demonstrate good running/jumping/landing mechanics without instability - as allowed per MD    Status  On-going            Plan - 02/17/18 7639    Clinical Impression Statement  Pt. arrived late to therapy today thus session time limited.  Reports MD wanting increased focus on quad strengthening activities in therapy at recent f/u.  Treatment focusing on R quad/VMO strengthening activities with pt. only requiring minor cueing for proper positioning and pacing.  Win reporting R quad fatigue following therex today and progressing well with strengthening activities.  HEP updated.  Will continue to progress toward goals.      PT Treatment/Interventions  ADLs/Self  Care Home Management;Cryotherapy;Electrical Stimulation;Iontophoresis 23m/ml Dexamethasone;Moist Heat;Therapeutic exercise;Therapeutic activities;Functional mobility training;Balance training;Neuromuscular re-education;Patient/family education;Manual techniques;Vasopneumatic Device;Taping;Dry needling;Passive range of motion    PT Next Visit Plan  SLOW progress rehab - no running, jumping, squatting for 2 months - per MD    Consulted and Agree with Plan of Care  Patient       Patient will benefit from skilled therapeutic intervention in order to improve the following deficits and impairments:  Pain, Decreased strength, Decreased mobility, Decreased activity tolerance  Visit Diagnosis: Acute pain of right knee  Other symptoms and signs involving the musculoskeletal system  Muscle weakness (generalized)     Problem List Patient Active Problem List   Diagnosis Date Noted  . Acute lateral meniscus tear of right knee 01/14/2018  . Sports physical 06/10/2017    MBess Harvest PTA 02/17/18 12:52 PM  [facility] [address] [place] [address]Phone: [phone]  Fax:  [phone] Name: Matthew BOSTWICKMRN: [id number] of Birth: 106-07-2001

## 2018-02-23 ENCOUNTER — Ambulatory Visit: Payer: 59 | Admitting: Physical Therapy

## 2018-02-23 ENCOUNTER — Encounter: Payer: Self-pay | Admitting: Physical Therapy

## 2018-02-23 DIAGNOSIS — M25561 Pain in right knee: Secondary | ICD-10-CM | POA: Diagnosis not present

## 2018-02-23 DIAGNOSIS — R29898 Other symptoms and signs involving the musculoskeletal system: Secondary | ICD-10-CM | POA: Diagnosis not present

## 2018-02-23 DIAGNOSIS — M6281 Muscle weakness (generalized): Secondary | ICD-10-CM

## 2018-02-23 NOTE — Therapy (Signed)
[facility] [address]  [place], [place], [address] Phone: [phone]   Fax:  [phone]  Physical Therapy Treatment  Patient Details  Name: Matthew Gill MRN: [id number] Date of Birth: 2001/06/17 Referring Provider: Dr. Noemi Chapel   Encounter Date: 02/23/2018  Gill End of Session - 02/23/18 0851    Visit Number  5    Number of Visits  12    Date for Gill Re-Evaluation  03/22/18    Gill Start Time  0800    Gill Stop Time  0847    Gill Time Calculation (min)  47 min    Activity Tolerance  Patient tolerated treatment well    Behavior During Therapy  [facility] for tasks assessed/performed       Past Medical History:  Diagnosis Date  . Acute lateral meniscus tear of right knee 01/14/2018  . Medical history non-contributory     Past Surgical History:  Procedure Laterality Date  . KNEE ARTHROSCOPY WITH LATERAL MENISECTOMY Right 01/15/2018   Procedure: RIGHT KNEE ARTHROSCOPY WITH PARTIAL LATERAL MENISECTOMY;  Surgeon: Elsie Saas, MD;  Location: [place];  Service: Orthopedics;  Laterality: Right;  . NO PAST SURGERIES      There were no vitals filed for this visit.  Subjective Assessment - 02/23/18 0801    Subjective  Patient reports his knee has been better. Went to prom last weekend but did not dance because he didn't want to hurt his knee.     Diagnostic tests  MRI: R lateral meniscus tear    Patient Stated Goals  return to playing ball    Currently in Pain?  No/denies                       [facility] Adult Gill Treatment/Exercise - 02/23/18 0817      Knee/Hip Exercises: Aerobic   Stationary Bike  L2 x 6 min      Knee/Hip Exercises: Machines for Strengthening   Cybex Knee Extension  B LE 10x 25#, 10x 35#    Cybex Knee Flexion  B LE - 20# x 10; B con/R ecc - 15# x 10      Knee/Hip Exercises: Standing   Hip Flexion  Both;2 sets;10 reps;Limitations    Hip Flexion Limitations  captain  morgans- alt hip flex & abd with red TB around knees    Lateral Step Up  Step Height: 8";Right;15 reps;Hand Hold: 0    Forward Step Up  Step Height: 8";Hand Hold: 0;15 reps;Right red TB around lateral knee    Forward Step Up Limitations  Focusing on eccentric step-back; VCs to avoid jerking and increase control    SLS with Vectors  SLS star reach out of BOS; 5x each LE VCs to active ERs to avoid valgus collapse      Knee/Hip Exercises: Seated   Other Seated Knee/Hip Exercises  R LE - fitter - 1 blue/1black x 15      Knee/Hip Exercises: Supine   Bridges  Both;Strengthening;2 sets;Limitations    Bridges Limitations  Bridge with alternate marching 2x12 VCs to contract glutes to avoid hips dropping    Straight Leg Raises  Strengthening;Right;2 sets;10 reps;Limitations    Straight Leg Raises Limitations  VCs to avoid quad lag    Straight Leg Raise with External Rotation  Strengthening;Right;2 sets;10 reps;Limitations    Straight Leg Raise with External Rotation Limitations  VCs to avoid quad lag  Knee/Hip Exercises: Sidelying   Hip ABduction  Strengthening;Right;2 sets;15 reps;Limitations    Hip ABduction Limitations  15x 0#, 15x 2#                   Gill Long Term Goals - 02/15/18 0849      Gill LONG TERM GOAL #1   Title  patient to be independent with advanced HEP    Status  On-going      Gill LONG TERM GOAL #2   Title  patient to improve R hip and knee strength to 5/5 for improved function    Status  On-going Still grossly 4/5 - 4+/5 at R LE       Gill LONG TERM GOAL #3   Title  patient to demonstrate eccentric step down of R LE without instability or pain    Status  Partially Met Pain free however instability at R quad noted on eccentric step-down however pain free      Gill LONG TERM GOAL #4   Title  patient to demonstrate good running/jumping/landing mechanics without instability - as allowed per MD    Status  On-going            Plan - 02/23/18 6295    Clinical  Impression Statement  Patient arrived to session with report that his R knee has been doing well- no report of pain but did not participate in dancing at his prom last weekend to avoid pain. Patient tolerated progressive hip and quad strengthening this visit. Required intermittent VC/TCs to correct form but not c/o pain throughout. Patient performed anterior and lateral step ups with good technique and effort to avoid valgus collapse; VCs required to avoid jerky movement pattern and increase control. Completed session without report of pain.    Gill Treatment/Interventions  ADLs/Self Care Home Management;Cryotherapy;Electrical Stimulation;Iontophoresis 68m/ml Dexamethasone;Moist Heat;Therapeutic exercise;Therapeutic activities;Functional mobility training;Balance training;Neuromuscular re-education;Patient/family education;Manual techniques;Vasopneumatic Device;Taping;Dry needling;Passive range of motion    Gill Next Visit Plan  SLOW progress rehab - no running, jumping, squatting for 2 months - per MD    Consulted and Agree with Plan of Care  Patient       Patient will benefit from skilled therapeutic intervention in order to improve the following deficits and impairments:  Pain, Decreased strength, Decreased mobility, Decreased activity tolerance  Visit Diagnosis: Acute pain of right knee  Other symptoms and signs involving the musculoskeletal system  Muscle weakness (generalized)     Problem List Patient Active Problem List   Diagnosis Date Noted  . Acute lateral meniscus tear of right knee 01/14/2018  . Sports physical 06/10/2017    Matthew Gill, DPT 02/23/18 8:54 AM   [facility] [place] [address]Phone: [phone]  Fax:  [phone] Name: Matthew LARDMRN: [id number] of Birth: [birth year]

## 2018-02-26 ENCOUNTER — Ambulatory Visit: Payer: 59

## 2018-02-26 DIAGNOSIS — R29898 Other symptoms and signs involving the musculoskeletal system: Secondary | ICD-10-CM | POA: Diagnosis not present

## 2018-02-26 DIAGNOSIS — M25561 Pain in right knee: Secondary | ICD-10-CM

## 2018-02-26 DIAGNOSIS — M6281 Muscle weakness (generalized): Secondary | ICD-10-CM

## 2018-02-26 NOTE — Therapy (Addendum)
[facility] [address]  [place], [place], [address] Phone: [phone]   Fax:  [phone]  Physical Therapy Treatment  Patient Details  Name: Matthew Gill MRN: [id number] Date of Birth: 2001/05/19 Referring Provider: Dr. Noemi Chapel   Encounter Date: 02/26/2018  PT End of Session - 02/26/18 0805    Visit Number  6    Number of Visits  12    Date for PT Re-Evaluation  03/22/18    PT Start Time  0801    PT Stop Time  0849    PT Time Calculation (min)  48 min    Activity Tolerance  Patient tolerated treatment well    Behavior During Therapy  [facility] for tasks assessed/performed       Past Medical History:  Diagnosis Date  . Acute lateral meniscus tear of right knee 01/14/2018  . Medical history non-contributory     Past Surgical History:  Procedure Laterality Date  . KNEE ARTHROSCOPY WITH LATERAL MENISECTOMY Right 01/15/2018   Procedure: RIGHT KNEE ARTHROSCOPY WITH PARTIAL LATERAL MENISECTOMY;  Surgeon: Elsie Saas, MD;  Location: [place];  Service: Orthopedics;  Laterality: Right;  . NO PAST SURGERIES      There were no vitals filed for this visit.  Subjective Assessment - 02/26/18 0803    Subjective  Pt. reporting he felt fine after last visit.  No new complaints.      Diagnostic tests  MRI: R lateral meniscus tear    Patient Stated Goals  return to playing ball    Currently in Pain?  No/denies    Multiple Pain Sites  No         [facility] PT Assessment - 02/26/18 0831      Assessment   Next MD Visit  6.3.19                   [facility] Adult PT Treatment/Exercise - 02/26/18 0806      Knee/Hip Exercises: Stretches   Other Knee/Hip Stretches  Foam roller to B HS x 30 sec each       Knee/Hip Exercises: Aerobic   Stationary Bike  L3 x 6 min      Knee/Hip Exercises: Standing   Hip Flexion Limitations  --    Forward Lunges  Right;Left;3 seconds;15 reps    Forward Lunges  Limitations  TRX - "mini" lunge to ~ 60 dg R knee flexion depth  Well tolerated     Lateral Step Up  --    Lateral Step Up Limitations  --    Forward Step Up  10 reps;3 sets;Right;Hand Hold: 0;Limitations    Forward Step Up Limitations  9" stool; with green TKE with neutral, varus, valgus pull x 10 each way    Step Down  Right;2 sets;15 reps;Step Height: 6";Hand Hold: 1    Step Down Limitations  1 light UE assist from therapist  Cues required throughout for upright posture     SLS with Vectors  R RDL's with UE reach to mat table concentrating on 3" con/ecc 2 x 5 reps     Other Standing Knee Exercises  B side stepping, monster walk - green TB at ankle x 80 feet each direction       Knee/Hip Exercises: Seated   Stool Scoot - Round Trips  2 x 150 ft in hallway Verbalized fatigue following this       Knee/Hip Exercises: Supine   Straight Leg Raises  Right;15 reps;Strengthening    Straight Leg Raises Limitations  3#    Straight Leg Raise with External Rotation  Right;15 reps;Strengthening    Straight Leg Raise with External Rotation Limitations  3#    Other Supine Knee/Hip Exercises  HS bridge + HS curl x 20 reps      Knee/Hip Exercises: Prone   Other Prone Exercises  Prone plank (focusing on maintaining TKE) 2 x 1 min              PT Education - 02/26/18 0855    Education provided  Yes    Education Details  4-way hip kicker with red TB issued to pt.     Person(s) Educated  Patient    Methods  Explanation;Demonstration;Verbal cues;Handout    Comprehension  Verbalized understanding;Returned demonstration;Verbal cues required;Need further instruction          PT Long Term Goals - 02/15/18 0849      PT LONG TERM GOAL #1   Title  patient to be independent with advanced HEP    Status  On-going      PT LONG TERM GOAL #2   Title  patient to improve R hip and knee strength to 5/5 for improved function    Status  On-going Still grossly 4/5 - 4+/5 at R LE       PT LONG TERM GOAL  #3   Title  patient to demonstrate eccentric step down of R LE without instability or pain    Status  Partially Met Pain free however instability at R quad noted on eccentric step-down however pain free      PT LONG TERM GOAL #4   Title  patient to demonstrate good running/jumping/landing mechanics without instability - as allowed per MD    Status  On-going            Plan - 02/26/18 0805    Clinical Impression Statement  Pt. doing well today reporting he felt fine after last visit with no new complaints.  Reports he is performing updated HEP without issue.  Tolerated all quad/VMO focused strengthening activities today well.  Tolerated addition of HS stool scoot and prone planking well today however did require occasional cueing for full TKE with quad strengthening activities to ensure quality of movement pattern.  HEP updated with 4-way hip kicker today with red TB issued to pt.  Pt. progressing well at this point.      PT Treatment/Interventions  ADLs/Self Care Home Management;Cryotherapy;Electrical Stimulation;Iontophoresis 79m/ml Dexamethasone;Moist Heat;Therapeutic exercise;Therapeutic activities;Functional mobility training;Balance training;Neuromuscular re-education;Patient/family education;Manual techniques;Vasopneumatic Device;Taping;Dry needling;Passive range of motion    PT Next Visit Plan  SLOW progress rehab - no running, jumping, squatting for 2 months - per MD    Consulted and Agree with Plan of Care  Patient       Patient will benefit from skilled therapeutic intervention in order to improve the following deficits and impairments:  Pain, Decreased strength, Decreased mobility, Decreased activity tolerance  Visit Diagnosis: Acute pain of right knee  Other symptoms and signs involving the musculoskeletal system  Muscle weakness (generalized)     Problem List Patient Active Problem List   Diagnosis Date Noted  . Acute lateral meniscus tear of right knee 01/14/2018   . Sports physical 06/10/2017    MBess Harvest PTA 02/26/18 11:15 AM  [facility] [address] [place] [address]Phone: [phone]  Fax:  [phone] Name: Matthew Gill MRN: [id number] Date of Birth: Aug 12, 2001

## 2018-03-02 ENCOUNTER — Ambulatory Visit: Payer: 59

## 2018-03-02 DIAGNOSIS — R29898 Other symptoms and signs involving the musculoskeletal system: Secondary | ICD-10-CM | POA: Diagnosis not present

## 2018-03-02 DIAGNOSIS — M25561 Pain in right knee: Secondary | ICD-10-CM | POA: Diagnosis not present

## 2018-03-02 DIAGNOSIS — M6281 Muscle weakness (generalized): Secondary | ICD-10-CM

## 2018-03-02 NOTE — Therapy (Signed)
[facility] [address]  [place], [place], [address] Phone: [phone]   Fax:  [phone]  Physical Therapy Treatment  Patient Details  Name: Matthew Gill MRN: [id number] Date of Birth: 2001-10-06 Referring Provider: Dr. Noemi Chapel   Encounter Date: 03/02/2018  PT End of Session - 03/02/18 0801    Visit Number  7    Number of Visits  12    Date for PT Re-Evaluation  03/22/18    PT Start Time  0755    PT Stop Time  0841    PT Time Calculation (min)  46 min    Activity Tolerance  Patient tolerated treatment well    Behavior During Therapy  [facility] for tasks assessed/performed       Past Medical History:  Diagnosis Date  . Acute lateral meniscus tear of right knee 01/14/2018  . Medical history non-contributory     Past Surgical History:  Procedure Laterality Date  . KNEE ARTHROSCOPY WITH LATERAL MENISECTOMY Right 01/15/2018   Procedure: RIGHT KNEE ARTHROSCOPY WITH PARTIAL LATERAL MENISECTOMY;  Surgeon: Elsie Saas, MD;  Location: [place];  Service: Orthopedics;  Laterality: Right;  . NO PAST SURGERIES      There were no vitals filed for this visit.  Subjective Assessment - 03/02/18 0800    Subjective  Pt. doing well today with no new complaints.      Diagnostic tests  MRI: R lateral meniscus tear    Patient Stated Goals  return to playing ball    Currently in Pain?  No/denies                       [facility] Adult PT Treatment/Exercise - 03/02/18 0802      Knee/Hip Exercises: Aerobic   Stationary Bike  L3 x 6 min      Knee/Hip Exercises: Machines for Strengthening   Cybex Knee Flexion  B LE - 35# x 10; B con/R ecc - 20# x 10      Knee/Hip Exercises: Standing   Hip Flexion  Right;Left;10 reps;Knee straight;Stengthening    Hip Flexion Limitations  Green TB at ankles; 2 ski poles     Hip ADduction  Right;Left;10 reps;Strengthening    Hip ADduction Limitations  green TB at  ankles; 2 ski poles     Hip Abduction  Right;Left;10 reps;Stengthening;Knee straight    Abduction Limitations  Green TB at ankles; 2 ski poles     Hip Extension  Right;Left;10 reps;Knee straight;Stengthening    Extension Limitations  Green TB at ankles; 2 ski poles     Step Down  Right;2 sets;15 reps;Step Height: 6";Hand Hold: 1    Step Down Limitations  1 light UE support at machine     Other Standing Knee Exercises  Forward/backwards Monster walk with green TB at ankles 2 x 40 ft each       Knee/Hip Exercises: Supine   Bridges  Both;15 reps    Bridges Limitations  with heels on 6" bolster 3" x 15 reps     Straight Leg Raises  Right;15 reps;Strengthening 3" eccentric     Straight Leg Raises Limitations  3#    Straight Leg Raise with External Rotation  Right;15 reps;Strengthening 3" eccentric     Straight Leg Raise with External Rotation Limitations  3#      Knee/Hip Exercises: Prone   Other Prone Exercises  Prone plank (focusing on maintaining TKE) 2 x 90  sec              PT Education - 03/02/18 0852    Education provided  Yes    Education Details  green TB issued to pt. for 4-way hip kicker     Person(s) Educated  Patient    Methods  Explanation;Verbal cues;Handout    Comprehension  Verbalized understanding;Verbal cues required;Need further instruction          PT Long Term Goals - 02/15/18 0849      PT LONG TERM GOAL #1   Title  patient to be independent with advanced HEP    Status  On-going      PT LONG TERM GOAL #2   Title  patient to improve R hip and knee strength to 5/5 for improved function    Status  On-going Still grossly 4/5 - 4+/5 at R LE       PT LONG TERM GOAL #3   Title  patient to demonstrate eccentric step down of R LE without instability or pain    Status  Partially Met Pain free however instability at R quad noted on eccentric step-down however pain free      PT LONG TERM GOAL #4   Title  patient to demonstrate good running/jumping/landing  mechanics without instability - as allowed per MD    Status  On-going            Plan - 03/02/18 [place] doing well today with no new complaints.  Tolerated progression of prone planking, 4-way hip kicker, and able to demo improved control with 6" eccentric step-down today.  Strengthening activities today focused on ensuring quad/VMO control with pt. verbalizing fatigue following session.  Pt. progressing well toward goals at this point.      PT Treatment/Interventions  ADLs/Self Care Home Management;Cryotherapy;Electrical Stimulation;Iontophoresis 101m/ml Dexamethasone;Moist Heat;Therapeutic exercise;Therapeutic activities;Functional mobility training;Balance training;Neuromuscular re-education;Patient/family education;Manual techniques;Vasopneumatic Device;Taping;Dry needling;Passive range of motion    PT Next Visit Plan  SLOW progress rehab - no running, jumping, squatting for 2 months - per MD    Consulted and Agree with Plan of Care  Patient       Patient will benefit from skilled therapeutic intervention in order to improve the following deficits and impairments:  Pain, Decreased strength, Decreased mobility, Decreased activity tolerance  Visit Diagnosis: Acute pain of right knee  Other symptoms and signs involving the musculoskeletal system  Muscle weakness (generalized)     Problem List Patient Active Problem List   Diagnosis Date Noted  . Acute lateral meniscus tear of right knee 01/14/2018  . Sports physical 06/10/2017    MBess Harvest PTA 03/02/18 8:58 AM  [facility] [place] [address]Phone: [phone]  Fax:  [phone] Name: Matthew KINSELMRN: [id number] of Birth: [birth year]

## 2018-03-04 ENCOUNTER — Ambulatory Visit: Payer: 59

## 2018-03-04 DIAGNOSIS — M25561 Pain in right knee: Secondary | ICD-10-CM

## 2018-03-04 DIAGNOSIS — M6281 Muscle weakness (generalized): Secondary | ICD-10-CM

## 2018-03-04 DIAGNOSIS — R29898 Other symptoms and signs involving the musculoskeletal system: Secondary | ICD-10-CM | POA: Diagnosis not present

## 2018-03-04 NOTE — Therapy (Signed)
[facility] [address]  [place], [place], [address] Phone: [phone]   Fax:  [phone]  Physical Therapy Treatment  Patient Details  Name: Matthew Gill MRN: [id number] Date of Birth: 2000/12/31 Referring Provider: Dr. Noemi Chapel   Encounter Date: 03/04/2018  PT End of Session - 03/04/18 0810    Visit Number  8    Number of Visits  12    Date for PT Re-Evaluation  03/22/18    PT Start Time  0803    PT Stop Time  0846    PT Time Calculation (min)  43 min    Activity Tolerance  Patient tolerated treatment well    Behavior During Therapy  [facility] for tasks assessed/performed       Past Medical History:  Diagnosis Date  . Acute lateral meniscus tear of right knee 01/14/2018  . Medical history non-contributory     Past Surgical History:  Procedure Laterality Date  . KNEE ARTHROSCOPY WITH LATERAL MENISECTOMY Right 01/15/2018   Procedure: RIGHT KNEE ARTHROSCOPY WITH PARTIAL LATERAL MENISECTOMY;  Surgeon: Elsie Saas, MD;  Location: [facility];  Service: Orthopedics;  Laterality: Right;  . NO PAST SURGERIES      There were no vitals filed for this visit.  Subjective Assessment - 03/04/18 0809    Subjective  Pt. doing well today.     Diagnostic tests  MRI: R lateral meniscus tear    Patient Stated Goals  return to playing ball    Currently in Pain?  No/denies    Multiple Pain Sites  No                       [facility] Adult PT Treatment/Exercise - 03/04/18 0810      Knee/Hip Exercises: Aerobic   Stationary Bike  L4 x 6 min      Knee/Hip Exercises: Machines for Strengthening   Cybex Knee Flexion  B LE - 35# x 15; B con/R ecc - 25# x 15      Knee/Hip Exercises: Standing   Step Down  Right;2 sets;15 reps;Step Height: 6";Hand Hold: 1 Improved control today     Step Down Limitations  1 light UE support at machine     SLS  R SLS on BOSU ball (down) with opposite LE toe-touch support  on BOSU 2 x 30 sec  Pt. verbalizing difficulty - minor R LE instability     Other Standing Knee Exercises  B side-stepping with green TB at ankles x 80 ft        Knee/Hip Exercises: Seated   Stool Scoot - Round Trips  2 x 200 ft in hallway Pt. verbalized fatigue following this       Knee/Hip Exercises: Supine   Single Leg Bridge  Right;15 reps required cueing to maintain proper positioning     Straight Leg Raises  Right;15 reps;Strengthening    Straight Leg Raises Limitations  4#      Knee/Hip Exercises: Prone   Other Prone Exercises  Prone plank with L LE kick-up 3 x 50 sec  Pt. noting fatigue following this                   PT Long Term Goals - 02/15/18 0849      PT LONG TERM GOAL #1   Title  patient to be independent with advanced HEP    Status  On-going      PT  LONG TERM GOAL #2   Title  patient to improve R hip and knee strength to 5/5 for improved function    Status  On-going Still grossly 4/5 - 4+/5 at R LE       PT LONG TERM GOAL #3   Title  patient to demonstrate eccentric step down of R LE without instability or pain    Status  Partially Met Pain free however instability at R quad noted on eccentric step-down however pain free      PT LONG TERM GOAL #4   Title  patient to demonstrate good running/jumping/landing mechanics without instability - as allowed per MD    Status  On-going            Plan - 03/04/18 [place] tolerated progression of prone plank, SLR, and HS stool scoot well today.  Pt. verbalized fatigue following therex however remained pain free throughout session.  Pt. demonstrating some improvement in eccentric quad control with 6" step-down today.  Pt. progressing well toward goals.      PT Treatment/Interventions  ADLs/Self Care Home Management;Cryotherapy;Electrical Stimulation;Iontophoresis 11m/ml Dexamethasone;Moist Heat;Therapeutic exercise;Therapeutic activities;Functional mobility training;Balance  training;Neuromuscular re-education;Patient/family education;Manual techniques;Vasopneumatic Device;Taping;Dry needling;Passive range of motion    PT Next Visit Plan  SLOW progress rehab - no running, jumping, squatting for 2 months - per MD    Consulted and Agree with Plan of Care  Patient       Patient will benefit from skilled therapeutic intervention in order to improve the following deficits and impairments:  Pain, Decreased strength, Decreased mobility, Decreased activity tolerance  Visit Diagnosis: Acute pain of right knee  Other symptoms and signs involving the musculoskeletal system  Muscle weakness (generalized)     Problem List Patient Active Problem List   Diagnosis Date Noted  . Acute lateral meniscus tear of right knee 01/14/2018  . Sports physical 06/10/2017    MBess Harvest PTA 03/04/18 10:34 AM  [facility] [place] [address]Phone: [phone]  Fax:  [phone] Name: Matthew BUDDENHAGENMRN: [id number] of Birth: [birth year]

## 2018-03-09 ENCOUNTER — Encounter: Payer: Self-pay | Admitting: Physical Therapy

## 2018-03-09 ENCOUNTER — Ambulatory Visit: Payer: 59 | Admitting: Physical Therapy

## 2018-03-09 DIAGNOSIS — R29898 Other symptoms and signs involving the musculoskeletal system: Secondary | ICD-10-CM

## 2018-03-09 DIAGNOSIS — M25561 Pain in right knee: Secondary | ICD-10-CM | POA: Diagnosis not present

## 2018-03-09 DIAGNOSIS — M6281 Muscle weakness (generalized): Secondary | ICD-10-CM

## 2018-03-09 NOTE — Therapy (Signed)
[facility] [address]  [place], [place], [address] Phone: [phone]   Fax:  [phone]  Physical Therapy Treatment  Patient Details  Name: Matthew Gill MRN: [id number] Date of Birth: 01/04/2001 Referring Provider: Dr. Noemi Chapel   Encounter Date: 03/09/2018  PT End of Session - 03/09/18 0806    Visit Number  9    Number of Visits  12    Date for PT Re-Evaluation  03/22/18    PT Start Time  0804    PT Stop Time  0844    PT Time Calculation (min)  40 min    Activity Tolerance  Patient tolerated treatment well    Behavior During Therapy  [facility] for tasks assessed/performed       Past Medical History:  Diagnosis Date  . Acute lateral meniscus tear of right knee 01/14/2018  . Medical history non-contributory     Past Surgical History:  Procedure Laterality Date  . KNEE ARTHROSCOPY WITH LATERAL MENISECTOMY Right 01/15/2018   Procedure: RIGHT KNEE ARTHROSCOPY WITH PARTIAL LATERAL MENISECTOMY;  Surgeon: Elsie Saas, MD;  Location: [facility];  Service: Orthopedics;  Laterality: Right;  . NO PAST SURGERIES      There were no vitals filed for this visit.  Subjective Assessment - 03/09/18 0805    Subjective  doing well - knee is feeling well - no pain recently    Diagnostic tests  MRI: R lateral meniscus tear    Patient Stated Goals  return to playing ball    Currently in Pain?  No/denies                       [facility] Adult PT Treatment/Exercise - 03/09/18 0807      Knee/Hip Exercises: Stretches   Other Knee/Hip Stretches  foam roller to quads - 3 way x 1 min each      Knee/Hip Exercises: Aerobic   Elliptical  L4 x 6 min      Knee/Hip Exercises: Machines for Strengthening   Cybex Knee Extension  B cone/R ecc - 15# x 15    Cybex Knee Flexion  B LE - 45# x 15      Knee/Hip Exercises: Standing   Hip Flexion  Stengthening;Right;Left;15 reps;Knee straight + step up    Hip  Flexion Limitations  green tband - no UE support    Hip Abduction  Stengthening;Right;Left;15 reps;Knee straight + step up    Abduction Limitations  green tband - no UE support    Hip Extension  Stengthening;Right;Left;15 reps;Knee bent    Extension Limitations  fitter - 2 blue bands - foot at midplate    SLS with Vectors  R SLS on foam + ball tosses x 3 min    Other Standing Knee Exercises  B side stepping - green tband at forefoot x 40 ft each direction                  PT Long Term Goals - 02/15/18 0849      PT LONG TERM GOAL #1   Title  patient to be independent with advanced HEP    Status  On-going      PT LONG TERM GOAL #2   Title  patient to improve R hip and knee strength to 5/5 for improved function    Status  On-going Still grossly 4/5 - 4+/5 at R LE       PT LONG  TERM GOAL #3   Title  patient to demonstrate eccentric step down of R LE without instability or pain    Status  Partially Met Pain free however instability at R quad noted on eccentric step-down however pain free      PT LONG TERM GOAL #4   Title  patient to demonstrate good running/jumping/landing mechanics without instability - as allowed per MD    Status  On-going            Plan - 03/09/18 [place] continues to do well with all activities in PT session. Does still rewuire some standing rest breaks due to general fatige as well as slgiht unsteadiness with SLS on compliant surfaces. Patient to continue to benefit form PT to address these deficits to all for safe return to sport activities.     PT Treatment/Interventions  ADLs/Self Care Home Management;Cryotherapy;Electrical Stimulation;Iontophoresis 6m/ml Dexamethasone;Moist Heat;Therapeutic exercise;Therapeutic activities;Functional mobility training;Balance training;Neuromuscular re-education;Patient/family education;Manual techniques;Vasopneumatic Device;Taping;Dry needling;Passive range of motion    PT Next  Visit Plan  SLOW progress rehab - no running, jumping, squatting for 2 months - per MD    Consulted and Agree with Plan of Care  Patient       Patient will benefit from skilled therapeutic intervention in order to improve the following deficits and impairments:  Pain, Decreased strength, Decreased mobility, Decreased activity tolerance  Visit Diagnosis: Acute pain of right knee  Other symptoms and signs involving the musculoskeletal system  Muscle weakness (generalized)     Problem List Patient Active Problem List   Diagnosis Date Noted  . Acute lateral meniscus tear of right knee 01/14/2018  . Sports physical 06/10/2017     SLanney Gins PT, DPT 03/09/18 8:45 AM   [facility] [place] [address]Phone: [phone]  Fax:  [phone] Name: Matthew OLVERMRN: [id number] of Birth: 105-Oct-2002

## 2018-03-12 ENCOUNTER — Ambulatory Visit: Payer: 59

## 2018-03-12 DIAGNOSIS — M25561 Pain in right knee: Secondary | ICD-10-CM | POA: Diagnosis not present

## 2018-03-12 DIAGNOSIS — R29898 Other symptoms and signs involving the musculoskeletal system: Secondary | ICD-10-CM | POA: Diagnosis not present

## 2018-03-12 DIAGNOSIS — M6281 Muscle weakness (generalized): Secondary | ICD-10-CM | POA: Diagnosis not present

## 2018-03-12 NOTE — Therapy (Addendum)
[facility] [address]  [place], [place], [address] Phone: [phone]   Fax:  [phone]  Physical Therapy Treatment  Patient Details  Name: Matthew Gill MRN: [id number] Date of Birth: [birth year] Referring Provider: Dr. Noemi Chapel   Encounter Date: 03/12/2018  PT End of Session - 03/12/18 0814    Visit Number  10    Number of Visits  12    Date for PT Re-Evaluation  03/22/18    PT Start Time  0803    PT Stop Time  0842    PT Time Calculation (min)  39 min    Activity Tolerance  Patient tolerated treatment well    Behavior During Therapy  [facility] for tasks assessed/performed       Past Medical History:  Diagnosis Date  . Acute lateral meniscus tear of right knee 01/14/2018  . Medical history non-contributory     Past Surgical History:  Procedure Laterality Date  . KNEE ARTHROSCOPY WITH LATERAL MENISECTOMY Right 01/15/2018   Procedure: RIGHT KNEE ARTHROSCOPY WITH PARTIAL LATERAL MENISECTOMY;  Surgeon: Elsie Saas, MD;  Location: [facility];  Service: Orthopedics;  Laterality: Right;  . NO PAST SURGERIES      There were no vitals filed for this visit.  Subjective Assessment - 03/12/18 0825    Subjective  Pt. doing well with no new complaints.  To see MD for f/u on Monday.      Diagnostic tests  MRI: R lateral meniscus tear    Patient Stated Goals  return to playing ball    Currently in Pain?  No/denies    Multiple Pain Sites  No         [facility] PT Assessment - 03/12/18 0810      AROM   Right/Left Knee  Right    Right Knee Extension  -1    Right Knee Flexion  137      Strength   Strength Assessment Site  Hip;Knee    Right/Left Hip  Right    Right Hip Flexion  4+/5    Right Hip ABduction  4+/5    Right/Left Knee  Right    Right Knee Flexion  4+/5    Right Knee Extension  5/5                   [facility] Adult PT Treatment/Exercise - 03/12/18 0820      Knee/Hip Exercises:  Aerobic   Elliptical  L4.5 x 6 min      Knee/Hip Exercises: Machines for Strengthening   Cybex Knee Extension  B cone/R ecc - 20# x 15 reps     Cybex Knee Flexion  B LE - 45# x 20 reps      Knee/Hip Exercises: Standing   Forward Step Up  15 reps;3 sets;Right;Hand Hold: 0;Limitations    Forward Step Up Limitations  9" stool; with green TKE with neutral, varus, valgus pull    Step Down  Right;2 sets;15 reps;Step Height: 6";Hand Hold: 1    Step Down Limitations  1 light UE support at machine     Other Standing Knee Exercises  B side stepping, monster walk with green TB at ankles x 80 ft each way       Knee/Hip Exercises: Prone   Other Prone Exercises  Prone plank with L LE kick-up 2 x 60 sec  PT Long Term Goals - 03/12/18 0816      PT LONG TERM GOAL #1   Title  patient to be independent with advanced HEP    Status  Partially Met Met for current       PT LONG TERM GOAL #2   Title  patient to improve R hip and knee strength to 5/5 for improved function    Status  Partially Met Met for R quad strength 5/5      PT LONG TERM GOAL #3   Title  patient to demonstrate eccentric step down of R LE without instability or pain    Status  Partially Met Pain free however still with mild instability at R quad noted       PT LONG TERM GOAL #4   Title  patient to demonstrate good running/jumping/landing mechanics without instability - as allowed per MD    Status  On-going Not yet addressed per MD restrictions             Plan - 03/12/18 4715    Clinical Impression Statement  Pt. doing well today with no new complaints.  Able to demo improvement in R LE strength partially meeting goal with MMT.  Able to demo some improvement in control with 6" step-down today however still with mild instability noted requiring light 1 UE support.  Notes he is performing HEP daily and consistently performing therex activities with good overall technique only requiring minor cueing  for pacing.  Pt. to see MD for f/u on 6.3.19 and progressing well per MD allowance.      PT Treatment/Interventions  ADLs/Self Care Home Management;Cryotherapy;Electrical Stimulation;Iontophoresis 24m/ml Dexamethasone;Moist Heat;Therapeutic exercise;Therapeutic activities;Functional mobility training;Balance training;Neuromuscular re-education;Patient/family education;Manual techniques;Vasopneumatic Device;Taping;Dry needling;Passive range of motion    PT Next Visit Plan  SLOW progress rehab - no running, jumping, squatting for 2 months - per MD    Consulted and Agree with Plan of Care  Patient       Patient will benefit from skilled therapeutic intervention in order to improve the following deficits and impairments:  Pain, Decreased strength, Decreased mobility, Decreased activity tolerance  Visit Diagnosis: Acute pain of right knee  Other symptoms and signs involving the musculoskeletal system  Muscle weakness (generalized)     Problem List Patient Active Problem List   Diagnosis Date Noted  . Acute lateral meniscus tear of right knee 01/14/2018  . Sports physical 06/10/2017    MBess Harvest PTA 03/12/18 10:06 AM   [facility] [address] [place] [address]Phone: [phone]  Fax:  [phone] Name: Matthew ZEHNDERMRN: [id number] of Birth: 1September 17, 2002  PHYSICAL THERAPY DISCHARGE SUMMARY  Visits from Start of Care: 10  Current functional level related to goals / functional outcomes: See above   Remaining deficits: Unable to assess as patient did not return for subsequent visits   Education / Equipment: See above Plan: Patient agrees to discharge.  Patient goals were partially met. Patient is being discharged due to not returning since the last visit.  ?????     YJanene Harvey PT, DPT 04/21/18 1:46 PM

## 2018-03-15 DIAGNOSIS — S83281D Other tear of lateral meniscus, current injury, right knee, subsequent encounter: Secondary | ICD-10-CM | POA: Diagnosis not present

## 2018-04-08 DIAGNOSIS — S83281D Other tear of lateral meniscus, current injury, right knee, subsequent encounter: Secondary | ICD-10-CM | POA: Diagnosis not present

## 2018-05-13 DIAGNOSIS — H5213 Myopia, bilateral: Secondary | ICD-10-CM | POA: Diagnosis not present

## 2018-05-13 DIAGNOSIS — S83281D Other tear of lateral meniscus, current injury, right knee, subsequent encounter: Secondary | ICD-10-CM | POA: Diagnosis not present

## 2018-06-10 DIAGNOSIS — S83281D Other tear of lateral meniscus, current injury, right knee, subsequent encounter: Secondary | ICD-10-CM | POA: Diagnosis not present

## 2018-08-13 ENCOUNTER — Ambulatory Visit: Payer: Self-pay

## 2018-08-13 ENCOUNTER — Ambulatory Visit (INDEPENDENT_AMBULATORY_CARE_PROVIDER_SITE_OTHER): Payer: 59 | Admitting: Family Medicine

## 2018-08-13 ENCOUNTER — Encounter: Payer: Self-pay | Admitting: Family Medicine

## 2018-08-13 VITALS — BP 110/58 | Ht 72.0 in | Wt 160.0 lb

## 2018-08-13 DIAGNOSIS — S76302A Unspecified injury of muscle, fascia and tendon of the posterior muscle group at thigh level, left thigh, initial encounter: Secondary | ICD-10-CM

## 2018-08-13 MED ORDER — MELOXICAM 15 MG PO TABS: ORAL_TABLET | ORAL | 2 refills | Status: DC

## 2018-08-13 MED ORDER — NITROGLYCERIN 0.2 MG/HR TD PT24: MEDICATED_PATCH | TRANSDERMAL | 1 refills | Status: DC

## 2018-08-13 NOTE — Patient Instructions (Addendum)
Nitroglycerin Protocol   Apply 1/4 nitroglycerin patch to affected area daily.  Change position of patch within the affected area every 24 hours.  You may experience a headache during the first 1-2 weeks of using the patch, these should subside.  If you experience headaches after beginning nitroglycerin patch treatment, you may take your preferred over the counter pain reliever.  Another side effect of the nitroglycerin patch is skin irritation or rash related to patch adhesive.  Please notify our office if you develop more severe headaches or rash, and stop the patch.  Tendon healing with nitroglycerin patch may require 12 to 24 weeks depending on the extent of injury.  Men should not use if taking Viagra, Cialis, or Levitra.   Do not use if you have migraines or rosacea.    Follow up in 2 weeks 

## 2018-08-13 NOTE — Progress Notes (Signed)
   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  He states that 2 weeks ago he was at a [employer], and felt some pain after jumping during a dog.  He states that he is set out since that time.  He has worked with Market researcher on rehabilitation.  He has tried slowly exercise in the muscle, he has tried whirlpool therapy, he has tried Aleve 400 mg twice daily, and he is tried compression sleeve with minimal benefit.  He did try to return yesterday to play but noticed pain after warming up.  He has been unable to do any activity since that time.  He states it is made worse when he is in stride, or trying to jump.  He denies any new trauma to the area.  He has no history of hamstring strain the past.  He has no numbness and tingling on his leg.  He has no weakness in the leg.  Past Surgeries: None Smoking: Denies Family Hx: Noncontributory  ROS: Per HPI; in addition no fever, no rash, no additional weakness, no additional numbness, no additional paresthesias, and no additional falls/injury.   All past medical history, medications, and allergies reviewed by myself at today's visit. Objective: BP (!) 110/58   Ht 6' (1.829 m)   Wt 160 lb (72.6 kg)   BMI 21.70 kg/m  Gen: Right-Hand Dominant. NAD, well groomed, a/o x3, normal affect.  CV: Well-perfused. Warm.  Resp: Non-labored.  Neuro: Sensation intact throughout. No gross coordination deficits.  Gait: Nonpathologic posture, unremarkable stride without signs of limp or balance issues.  Left lower extremity exam: No erythema, warmth, swelling noted.  Tenderness palpation over the ischial tuberosity on the left side.  Full range of motion of the knee in flexion extension.  Full range of motion of the hip and flexion, extension, abduction, adduction.  Right 5 out of 5 throughout testing.  Some pain with resisted knee flexion.  ULTRASOUND: Hamstring, left -  Ischial tuberosity visualized with insertion of semimembranosus and semi-tendinosis tendons.  Hypoechoic changes noted along these tendons at the attachment ischial tuberosity.  Some surrounding fluid around the tendon attachment. IMPRESSION: findings consistent with strain of left hamstring muscles.  Assessment and Plan: Left hamstring strain  We discussed treatment options with Basheer at today's visit.  We will start him on the Nitropatch protocol today.  We also started him on Mobic 15 mg daily.  We will bring him back for 2 weeks to rescan his hamstring on ultrasound.  Alric Quan, MD [facility] Sports Medicine Fellow 08/13/2018 12:16 PM

## 2018-08-14 NOTE — Progress Notes (Signed)
[facility]: Attending Note: I have examined the patient,reviewed the char and ultrasound images, discussed wit the Sports Medicine Fellow. I agree with assessment and treatment plan as detailed in the Fellow's note.

## 2018-08-14 NOTE — Addendum Note (Signed)
Addended byDenny Levy L on: 08/14/2018 09:43 AM   Modules accepted: Level of Service

## 2018-08-27 ENCOUNTER — Ambulatory Visit (INDEPENDENT_AMBULATORY_CARE_PROVIDER_SITE_OTHER): Payer: PRIVATE HEALTH INSURANCE | Admitting: Family Medicine

## 2018-08-27 ENCOUNTER — Ambulatory Visit: Payer: Self-pay

## 2018-08-27 ENCOUNTER — Encounter: Payer: Self-pay | Admitting: Family Medicine

## 2018-08-27 VITALS — BP 102/62 | Ht 72.0 in | Wt 165.0 lb

## 2018-08-27 DIAGNOSIS — S76302D Unspecified injury of muscle, fascia and tendon of the posterior muscle group at thigh level, left thigh, subsequent encounter: Secondary | ICD-10-CM

## 2018-08-27 NOTE — Progress Notes (Signed)
[facility]: Attending Note: I have reviewed the chart, discussed wit the Sports Medicine Fellow. I agree with assessment and treatment plan as detailed in the Fellow's note. Significant improvement.  We discussed gradual transition back to full activity.  I will continue the Nitropatch for another 4 weeks at least.  If he is doing well and totally back to activity, he can then discontinue it.  If not, he will follow-up at that time and certainly sooner if problems

## 2018-08-27 NOTE — Progress Notes (Signed)
  Lauralyn PrimesKobe D Raske - 17 y.o. 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  Overall, he reports feeling significantly better.  He has about 3/10 pain with activity.  He has begun doing light workouts but has not fully returned to practice.  He has been using estrogen patches and reports good tolerance.  He denies any new feeling of swelling, numbness or tingling, or associated skin changes.   ROS:     See HPI  PERTINENT  PMH / PSH FH / / SH:  Past Medical, Surgical, Social, and Family History Reviewed & Updated in the EMR.    OBJECTIVE: BP 102/62   Ht 6' (1.829 m)   Wt 165 lb (74.8 kg)   BMI 22.38 kg/m   Physical Exam:  Vital signs are reviewed.  GEN: Alert and oriented, NAD Pulm: Breathing unlabored PSY: normal mood, congruent affect  MSK: Left Hip:  - Inspection: No gross deformity, no swelling, erythema, or ecchymosis - Palpation: He continues to have some tenderness with deep palpation over the ischial tuberosity. - ROM: Normal range of motion on Flexion, extension, abduction, internal and external rotation - Strength: Normal strength.  No pain with resisted knee flexion or hip extension - Neuro/vasc: NV intact distally MSK US: As he continues to have a small area of swelling of the proximal hamstring attachment on the issue tuberosity.  Ultrasound demonstrates significant loss of appropriate interval healing   ASSESSMENT & PLAN:  1.  Left proximal hamstring strain-overall he is improving quite well.  Good interval healing seen on ultrasound. -We will continue the nitro glycerin patches for additional 4 weeks. - Producer, television/film/videoAthletic training staff at [employer] we will continue to progress his activity - Follow-up in 4 weeks

## 2018-08-31 DIAGNOSIS — M79605 Pain in left leg: Secondary | ICD-10-CM | POA: Diagnosis not present

## 2018-12-04 DIAGNOSIS — W19XXXA Unspecified fall, initial encounter: Secondary | ICD-10-CM | POA: Diagnosis not present

## 2018-12-04 DIAGNOSIS — S199XXA Unspecified injury of neck, initial encounter: Secondary | ICD-10-CM | POA: Diagnosis not present

## 2018-12-04 DIAGNOSIS — M25511 Pain in right shoulder: Secondary | ICD-10-CM | POA: Diagnosis not present

## 2018-12-04 DIAGNOSIS — R200 Anesthesia of skin: Secondary | ICD-10-CM | POA: Diagnosis not present

## 2018-12-04 DIAGNOSIS — S060X1A Concussion with loss of consciousness of 30 minutes or less, initial encounter: Secondary | ICD-10-CM | POA: Diagnosis not present

## 2018-12-04 DIAGNOSIS — M546 Pain in thoracic spine: Secondary | ICD-10-CM | POA: Diagnosis not present

## 2018-12-04 DIAGNOSIS — M542 Cervicalgia: Secondary | ICD-10-CM | POA: Diagnosis not present

## 2018-12-04 DIAGNOSIS — S0990XA Unspecified injury of head, initial encounter: Secondary | ICD-10-CM | POA: Diagnosis not present

## 2018-12-04 DIAGNOSIS — M549 Dorsalgia, unspecified: Secondary | ICD-10-CM | POA: Diagnosis not present

## 2018-12-04 DIAGNOSIS — M545 Low back pain: Secondary | ICD-10-CM | POA: Diagnosis not present

## 2019-05-04 ENCOUNTER — Other Ambulatory Visit: Payer: Self-pay

## 2019-05-04 ENCOUNTER — Ambulatory Visit (INDEPENDENT_AMBULATORY_CARE_PROVIDER_SITE_OTHER): Payer: 59

## 2019-05-04 ENCOUNTER — Ambulatory Visit (INDEPENDENT_AMBULATORY_CARE_PROVIDER_SITE_OTHER): Payer: 59 | Admitting: Orthopedic Surgery

## 2019-05-04 ENCOUNTER — Encounter: Payer: Self-pay | Admitting: Orthopedic Surgery

## 2019-05-04 DIAGNOSIS — M79671 Pain in right foot: Secondary | ICD-10-CM

## 2019-05-04 MED ORDER — DICLOFENAC EPOLAMINE 1.3 % TD PTCH: MEDICATED_PATCH | TRANSDERMAL | 0 refills | Status: DC

## 2019-05-04 MED ORDER — DICLOFENAC SODIUM 75 MG PO TBEC: DELAYED_RELEASE_TABLET | ORAL | 0 refills | Status: DC

## 2019-05-04 NOTE — Progress Notes (Signed)
Office Visit Note   Patient: Matthew Gill           Date of Birth: [birth year]           MRN: [id number] Visit Date: 05/04/2019 Requested by: Alba Cory, MD [address] [place],  [place] [address] PCP: Alba Cory, MD  Subjective: Chief Complaint  Patient presents with  . achilles pain  Patient evaluated.  Agree with Luke's evaluation.  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  He has no visual focal swelling.  Localizes his pain just distal to the watershed area.  Mild pain walking around campus but a lot of pain when he is playing basketball.  Ultrasound did show a little bit of peritenon inflammation on the right compared to the left.  No structural defects or calcification or micro-tearing visible via ultrasound in that right Achilles tendon.  I like to try him on a Voltaren taper over the next 4 weeks with discontinuing other anti-inflammatories.  Flector patch also to be applied 12 hours/day and a small area 2 inches x 1 inch to avoid too much Voltaren absorption.  I think it would also be potentially helpful and not harmful to try dry needling in the gastroc region in order to facilitate healing.  The trainer has already tried all multitude of nonoperative conservative treatment options.  I think we could try orthotics next as a final attempt to control this pain.  HPI: Matthew Gill is a 18 y.o. male who presents to the office complaining of Rt heel pain.  Pt states that pain came on gradually starting 2 weeks ago.  Denies any specific event or injury leading to the pain.  He localizes the pain to his Rt achilles tendon, above the insertion point.  Pain is worse with walking, running, or any athletic movements involving the foot.  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  He has worked with the trained who has tried topical voltaren/lidocaine gel, phonophoresis, taping, Ibuprofen, stretching, and ice bath modalities.  Has not  tried dry needling.  No history of achilles tendinopathy.    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                ROS: All systems reviewed are negative as they relate to the chief complaint within the history of present illness.  Patient denies  fevers or chills.   Assessment & Plan: Visit Diagnoses:  1. 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  On exam, thompson test revealed no suspicion for rupture of achilles, with tenderness distal to the watershed region.  XR's were negative for evidence of insertional achilles tendinitis. In-office US revealed no microtears or significant rupture of the Rt achilles tendon when compared to the contralateral side.  Recommended a course of oral Voltaren.  If no improvement, will f/u with clinic and consider dry-needling/orthotics.    Follow-Up Instructions: No follow-ups on file.   Orders:  Orders Placed This Encounter  Procedures  . XR Ankle 2 Views Right   Meds ordered this encounter  Medications  . diclofenac (VOLTAREN) 75 MG EC tablet    Sig: 1 po bid x 2 weeks then 1 po q d x 2 weeks    Dispense:  40 tablet    Refill:  0  . diclofenac (FLECTOR) 1.3 % PTCH  Sig: Apply 2 inch x 1 inch rectangle to right achilles tendon for 12 hours once a day    Dispense:  30 patch    Refill:  0      Procedures: No procedures performed   Clinical Data: No additional findings.  Objective: Vital Signs: There were no vitals taken for this visit.  Physical Exam:   Constitutional: Patient appears well-developed HEENT:  Head: Normocephalic Eyes:EOM are normal Neck: Normal range of motion Cardiovascular: Normal rate Pulmonary/chest: Effort normal Neurologic: Patient is alert Skin: Skin is warm Psychiatric: Patient has normal mood and affect    Ortho Exam:  TTP over the distal achilles tendon, distal to the watershed region No TTP over the  insertion of the achilles tendon, the retrocalcaneal bursa, or the calf muscle Negative thompson test, no difference between Rt and Lt sided tests   Specialty Comments:  No specialty comments available.  Imaging: No results found.   PMFS History: Patient Active Problem List   Diagnosis Date Noted  . Acute lateral meniscus tear of right knee 01/14/2018  . Sports physical 06/10/2017   Past Medical History:  Diagnosis Date  . Acute lateral meniscus tear of right knee 01/14/2018  . Medical history non-contributory     Family History  Problem Relation Age of Onset  . Sudden death Neg Hx   . Heart attack Neg Hx     Past Surgical History:  Procedure Laterality Date  . KNEE ARTHROSCOPY WITH LATERAL MENISECTOMY Right 01/15/2018   Procedure: RIGHT KNEE ARTHROSCOPY WITH PARTIAL LATERAL MENISECTOMY;  Surgeon: Salvatore MarvelWainer, Robert, MD;  Location: [facility];  Service: Orthopedics;  Laterality: Right;  . NO PAST SURGERIES     Social History   Occupational History  . Not on file  Tobacco Use  . Smoking status: Never Smoker  . Smokeless tobacco: Never Used  Substance and Sexual Activity  . Alcohol use: No  . Drug use: No  . Sexual activity: Not on file

## 2019-05-05 ENCOUNTER — Encounter: Payer: Self-pay | Admitting: Orthopedic Surgery

## 2019-05-20 DIAGNOSIS — H5213 Myopia, bilateral: Secondary | ICD-10-CM | POA: Diagnosis not present

## 2019-06-14 DIAGNOSIS — Z03818 Encounter for observation for suspected exposure to other biological agents ruled out: Secondary | ICD-10-CM | POA: Diagnosis not present

## 2019-07-11 DIAGNOSIS — M9906 Segmental and somatic dysfunction of lower extremity: Secondary | ICD-10-CM | POA: Diagnosis not present

## 2019-07-11 DIAGNOSIS — M7072 Other bursitis of hip, left hip: Secondary | ICD-10-CM | POA: Diagnosis not present

## 2019-07-11 DIAGNOSIS — M9905 Segmental and somatic dysfunction of pelvic region: Secondary | ICD-10-CM | POA: Diagnosis not present

## 2019-07-11 DIAGNOSIS — M9903 Segmental and somatic dysfunction of lumbar region: Secondary | ICD-10-CM | POA: Diagnosis not present

## 2019-08-11 DIAGNOSIS — N341 Nonspecific urethritis: Secondary | ICD-10-CM | POA: Diagnosis not present

## 2019-09-07 DIAGNOSIS — Z03818 Encounter for observation for suspected exposure to other biological agents ruled out: Secondary | ICD-10-CM | POA: Diagnosis not present

## 2019-09-09 DIAGNOSIS — Z03818 Encounter for observation for suspected exposure to other biological agents ruled out: Secondary | ICD-10-CM | POA: Diagnosis not present

## 2019-09-11 DIAGNOSIS — Z03818 Encounter for observation for suspected exposure to other biological agents ruled out: Secondary | ICD-10-CM | POA: Diagnosis not present

## 2019-09-13 DIAGNOSIS — Z03818 Encounter for observation for suspected exposure to other biological agents ruled out: Secondary | ICD-10-CM | POA: Diagnosis not present

## 2019-09-15 DIAGNOSIS — Z03818 Encounter for observation for suspected exposure to other biological agents ruled out: Secondary | ICD-10-CM | POA: Diagnosis not present

## 2020-07-13 DIAGNOSIS — Z202 Contact with and (suspected) exposure to infections with a predominantly sexual mode of transmission: Secondary | ICD-10-CM | POA: Diagnosis not present

## 2020-07-13 DIAGNOSIS — Z113 Encounter for screening for infections with a predominantly sexual mode of transmission: Secondary | ICD-10-CM | POA: Diagnosis not present

## 2020-08-20 DIAGNOSIS — Z7189 Other specified counseling: Secondary | ICD-10-CM | POA: Diagnosis not present

## 2020-08-20 DIAGNOSIS — Z113 Encounter for screening for infections with a predominantly sexual mode of transmission: Secondary | ICD-10-CM | POA: Diagnosis not present

## 2020-10-24 ENCOUNTER — Ambulatory Visit (INDEPENDENT_AMBULATORY_CARE_PROVIDER_SITE_OTHER): Payer: Self-pay | Admitting: Orthopedic Surgery

## 2020-10-24 DIAGNOSIS — M25561 Pain in right knee: Secondary | ICD-10-CM

## 2020-10-24 DIAGNOSIS — G8929 Other chronic pain: Secondary | ICD-10-CM

## 2020-11-05 ENCOUNTER — Ambulatory Visit (INDEPENDENT_AMBULATORY_CARE_PROVIDER_SITE_OTHER): Payer: Self-pay | Admitting: Orthopedic Surgery

## 2020-11-05 DIAGNOSIS — G8929 Other chronic pain: Secondary | ICD-10-CM

## 2020-11-05 DIAGNOSIS — M25561 Pain in right knee: Secondary | ICD-10-CM

## 2020-11-13 ENCOUNTER — Other Ambulatory Visit: Payer: Self-pay

## 2020-11-14 ENCOUNTER — Other Ambulatory Visit: Payer: Self-pay

## 2020-11-25 ENCOUNTER — Encounter: Payer: Self-pay | Admitting: Orthopedic Surgery

## 2020-11-25 NOTE — Progress Notes (Signed)
   Post-Op Visit Note   Patient: CARLE FENECH           Date of Birth: Jan 30, 2001           MRN: [id number] Visit Date: 10/24/2020 PCP: Velvet Bathe, MD   Assessment & Plan:  Chief Complaint: No chief complaint on file.  Visit Diagnoses:  1. Chronic pain of right knee     Plan: Jayshawn is a patient with right knee pain.  Underwent right knee posterior lateral meniscectomy in 2019.  Describes swelling for 2 weeks.  No known history of injury.  Denies any mechanical symptoms.  On exam he has mild effusion full range of motion no discrete joint line tenderness stable collateral and cruciate ligaments.  Ultrasound examination demonstrates no lateral sided meniscal cyst.  Radiographs show no arthritis.  Impression is overuse changes in the right knee of a patient who has had prior partial lateral meniscectomy.  Normally think it reaches the threshold for aspiration and injection but if it does then we can do that at the office.  For now we will continue with conservative measures and over-the-counter anti-inflammatories.  If that fails to alleviate the symptoms then aspiration and injection would be the next step.  Follow-Up Instructions: Return if symptoms worsen or fail to improve.   Orders:  No orders of the defined types were placed in this encounter.  No orders of the defined types were placed in this encounter.   Imaging: No results found.  PMFS History: Patient Active Problem List   Diagnosis Date Noted  . Acute lateral meniscus tear of right knee 01/14/2018  . Sports physical 06/10/2017   Past Medical History:  Diagnosis Date  . Acute lateral meniscus tear of right knee 01/14/2018  . Medical history non-contributory     Family History  Problem Relation Age of Onset  . Sudden death Neg Hx   . Heart attack Neg Hx     Past Surgical History:  Procedure Laterality Date  . KNEE ARTHROSCOPY WITH LATERAL MENISECTOMY Right 01/15/2018   Procedure: RIGHT KNEE ARTHROSCOPY WITH  PARTIAL LATERAL MENISECTOMY;  Surgeon: Salvatore Marvel, MD;  Location: [facility];  Service: Orthopedics;  Laterality: Right;  . NO PAST SURGERIES     Social History   Occupational History  . Not on file  Tobacco Use  . Smoking status: Never Smoker  . Smokeless tobacco: Never Used  Substance and Sexual Activity  . Alcohol use: No  . Drug use: No  . Sexual activity: Not on file

## 2020-11-25 NOTE — Progress Notes (Signed)
   Post-Op Visit Note   Patient: Matthew Gill           Date of Birth: 02/19/2001           MRN: [id number] Visit Date: 11/05/2020 PCP: Velvet Bathe, MD   Assessment & Plan:  Chief Complaint: No chief complaint on file.  Visit Diagnoses: No diagnosis found.  Plan: Please see the clinic note from approximately 10 days ago.  That clinic note captures the status of Toby who is having some right knee pain and swelling from overuse and a history of partial lateral meniscectomy in the right knee.  Follow-Up Instructions: No follow-ups on file.   Orders:  No orders of the defined types were placed in this encounter.  No orders of the defined types were placed in this encounter.   Imaging: No results found.  PMFS History: Patient Active Problem List   Diagnosis Date Noted  . Acute lateral meniscus tear of right knee 01/14/2018  . Sports physical 06/10/2017   Past Medical History:  Diagnosis Date  . Acute lateral meniscus tear of right knee 01/14/2018  . Medical history non-contributory     Family History  Problem Relation Age of Onset  . Sudden death Neg Hx   . Heart attack Neg Hx     Past Surgical History:  Procedure Laterality Date  . KNEE ARTHROSCOPY WITH LATERAL MENISECTOMY Right 01/15/2018   Procedure: RIGHT KNEE ARTHROSCOPY WITH PARTIAL LATERAL MENISECTOMY;  Surgeon: Salvatore Marvel, MD;  Location: [facility];  Service: Orthopedics;  Laterality: Right;  . NO PAST SURGERIES     Social History   Occupational History  . Not on file  Tobacco Use  . Smoking status: Never Smoker  . Smokeless tobacco: Never Used  Substance and Sexual Activity  . Alcohol use: No  . Drug use: No  . Sexual activity: Not on file

## 2021-02-11 ENCOUNTER — Ambulatory Visit (INDEPENDENT_AMBULATORY_CARE_PROVIDER_SITE_OTHER): Payer: Self-pay | Admitting: Orthopedic Surgery

## 2021-02-11 DIAGNOSIS — G8929 Other chronic pain: Secondary | ICD-10-CM

## 2021-02-11 DIAGNOSIS — M25561 Pain in right knee: Secondary | ICD-10-CM

## 2021-02-14 ENCOUNTER — Other Ambulatory Visit: Payer: Self-pay

## 2021-02-16 ENCOUNTER — Encounter: Payer: Self-pay | Admitting: Orthopedic Surgery

## 2021-02-16 NOTE — Progress Notes (Signed)
   Post-Op Visit Note   Patient: Matthew Gill           Date of Birth: 2000/11/17           MRN: [id number] Visit Date: 02/11/2021 PCP: Velvet Bathe, MD   Assessment & Plan:  Chief Complaint: No chief complaint on file.  Visit Diagnoses: No diagnosis found.  Plan: Dickie La is a patient who plays men's baseball.  Reports right anterior knee pain.  Describes decreased explosiveness with jumping.  Had right knee meniscal resection in the summer 2019.  Has had 3 episodes of swelling since that time.  Radiographs today under fluoroscopic evaluation show no joint space narrowing or spurring.  He denies any mechanical symptoms.  On examination he has full range of motion with no effusion collateral cruciate ligaments are stable no real patellofemoral crepitus.  No quad atrophy.  Impression is right knee pain which is primarily anterior.  He does have some diminished quad flexibility with about a foot difference heel to buttocks on prone stretching.  At this time plan for topical and stretching.  No indication for imaging or intervention at this time unless symptoms worsen.  Lack of effusion is encouraging at this time.  Follow-Up Instructions: No follow-ups on file.   Orders:  No orders of the defined types were placed in this encounter.  No orders of the defined types were placed in this encounter.   Imaging: No results found.  PMFS History: Patient Active Problem List   Diagnosis Date Noted  . Acute lateral meniscus tear of right knee 01/14/2018  . Sports physical 06/10/2017   Past Medical History:  Diagnosis Date  . Acute lateral meniscus tear of right knee 01/14/2018  . Medical history non-contributory     Family History  Problem Relation Age of Onset  . Sudden death Neg Hx   . Heart attack Neg Hx     Past Surgical History:  Procedure Laterality Date  . KNEE ARTHROSCOPY WITH LATERAL MENISECTOMY Right 01/15/2018   Procedure: RIGHT KNEE ARTHROSCOPY WITH PARTIAL LATERAL  MENISECTOMY;  Surgeon: Salvatore Marvel, MD;  Location: [facility];  Service: Orthopedics;  Laterality: Right;  . NO PAST SURGERIES     Social History   Occupational History  . Not on file  Tobacco Use  . Smoking status: Never Smoker  . Smokeless tobacco: Never Used  Substance and Sexual Activity  . Alcohol use: No  . Drug use: No  . Sexual activity: Not on file

## 2021-03-05 ENCOUNTER — Encounter (HOSPITAL_COMMUNITY): Payer: Self-pay | Admitting: Emergency Medicine

## 2021-03-05 ENCOUNTER — Encounter (HOSPITAL_BASED_OUTPATIENT_CLINIC_OR_DEPARTMENT_OTHER): Payer: Self-pay

## 2021-03-05 ENCOUNTER — Other Ambulatory Visit: Payer: Self-pay

## 2021-03-05 ENCOUNTER — Ambulatory Visit (HOSPITAL_COMMUNITY)
Admission: EM | Admit: 2021-03-05 | Discharge: 2021-03-05 | Disposition: A | Discharge status: Home / Self Care | Payer: 59

## 2021-03-05 ENCOUNTER — Emergency Department (HOSPITAL_BASED_OUTPATIENT_CLINIC_OR_DEPARTMENT_OTHER)
Admission: EM | Admit: 2021-03-05 | Discharge: 2021-03-05 | Disposition: A | Discharge status: Home / Self Care | Payer: 59 | Attending: Emergency Medicine | Admitting: Emergency Medicine

## 2021-03-05 DIAGNOSIS — R0602 Shortness of breath: Secondary | ICD-10-CM | POA: Insufficient documentation

## 2021-03-05 DIAGNOSIS — R55 Syncope and collapse: Secondary | ICD-10-CM | POA: Diagnosis not present

## 2021-03-05 DIAGNOSIS — R61 Generalized hyperhidrosis: Secondary | ICD-10-CM | POA: Insufficient documentation

## 2021-03-05 LAB — BASIC METABOLIC PANEL
Anion gap: 9 (ref 5–15)
BUN: 9 mg/dL (ref 6–20)
CO2: 30 mmol/L (ref 22–32)
Calcium: 9.5 mg/dL (ref 8.9–10.3)
Chloride: 97 mmol/L — ABNORMAL LOW (ref 98–111)
Creatinine, Ser: 1.09 mg/dL (ref 0.61–1.24)
GFR, Estimated: >60 mL/min (ref >60)
Glucose, Bld: 82 mg/dL (ref 70–99)
Potassium: 3.8 mmol/L (ref 3.5–5.1)
Sodium: 136 mmol/L (ref 135–145)

## 2021-03-05 LAB — URINALYSIS, ROUTINE W REFLEX MICROSCOPIC
Bilirubin Urine: NEGATIVE
Glucose, UA: NEGATIVE mg/dL
Hgb urine dipstick: NEGATIVE
Ketones, ur: NEGATIVE mg/dL
Leukocytes,Ua: NEGATIVE
Nitrite: NEGATIVE
Protein, ur: NEGATIVE mg/dL
Specific Gravity, Urine: <1.005 — ABNORMAL LOW (ref 1.005–1.030)
pH: 7 (ref 5.0–8.0)

## 2021-03-05 LAB — CBC
HCT: 46.6 % (ref 39.0–52.0)
Hemoglobin: 14.9 g/dL (ref 13.0–17.0)
MCH: 28 pg (ref 26.0–34.0)
MCHC: 32 g/dL (ref 30.0–36.0)
MCV: 87.4 fL (ref 80.0–100.0)
Platelets: 230 10*3/uL (ref 150–400)
RBC: 5.33 MIL/uL (ref 4.22–5.81)
RDW: 13.9 % (ref 11.5–15.5)
WBC: 18.7 10*3/uL — ABNORMAL HIGH (ref 4.0–10.5)
nRBC: 0 % (ref 0.0–0.2)

## 2021-03-05 LAB — CBG MONITORING, ED: Glucose-Capillary: 103 mg/dL — ABNORMAL HIGH (ref 70–99)

## 2021-03-05 NOTE — ED Provider Notes (Signed)
[facility] EMERGENCY DEPT Provider Note   CSN: [id number] Arrival date & time: 03/05/21  1543     History No chief complaint on file.   Matthew Gill is a 20 y.o. male.  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  Today, he rode 17 minutes on a stationary bike and then did an abdominal routine on the floor.  He started to stand up, and he suddenly felt that he was going to pass out.  His vision grew dim, and he started to sweat profusely.  He thinks he had a syncopal episode that lasted 3 or 5 minutes, but he was not confused when he came to.  This is his third syncopal episode, and it is his only 1 closely associated with exercise.  The other 2 occurred shortly after awakening in the morning.  1 was associated with a hot shower, and the other was relieved very quickly by drinking some water.  He is used to vigorous exercise and has never had exertional dyspnea or chest pain during exercise.  He also states that he typically eats cereal or sometimes a Nutrigrain bar prior to exercise.  Today he was exercising in a climate controlled area.  He has access to a trainer, and all his workouts are supervised.  The history is provided by the patient.  Loss of Consciousness Episode history:  Single Most recent episode:  Today Timing:  Constant Progression:  Resolved Chronicity:  New Context: standing up   Witnessed: yes   Relieved by:  Lying down and drinking Worsened by:  Posture Associated symptoms: diaphoresis and difficulty breathing   Associated symptoms: no chest pain, no fever, no focal weakness, no headaches, no palpitations, no seizures, no shortness of breath, no visual change, no vomiting and no weakness        Past Medical History:  Diagnosis Date  . Acute lateral meniscus tear of right knee 01/14/2018  . Medical history non-contributory     Patient Active Problem List   Diagnosis Date Noted  . Acute lateral meniscus tear of right knee  01/14/2018  . Sports physical 06/10/2017    Past Surgical History:  Procedure Laterality Date  . KNEE ARTHROSCOPY WITH LATERAL MENISECTOMY Right 01/15/2018   Procedure: RIGHT KNEE ARTHROSCOPY WITH PARTIAL LATERAL MENISECTOMY;  Surgeon: Salvatore Marvel, MD;  Location: [facility];  Service: Orthopedics;  Laterality: Right;  . NO PAST SURGERIES         Family History  Problem Relation Age of Onset  . Sudden death Neg Hx   . Heart attack Neg Hx     Social History   Tobacco Use  . Smoking status: Never Smoker  . Smokeless tobacco: Never Used  Substance Use Topics  . Alcohol use: No  . Drug use: No    Home Medications Prior to Admission medications   Medication Sig Start Date End Date Taking? Authorizing Provider  diclofenac (FLECTOR) 1.3 % PTCH Apply 2 inch x 1 inch rectangle to right achilles tendon for 12 hours once a day 05/04/19   Cammy Copa, MD  diclofenac (VOLTAREN) 75 MG EC tablet 1 po bid x 2 weeks then 1 po q d x 2 weeks 05/04/19   Cammy Copa, MD  meloxicam [facility]) 15 MG tablet Take one pill a day with food for 7 days and then prn thereafter 08/13/18   Gerarda Gunther, MD  nitroGLYCERIN (NITRODUR - DOSED IN MG/24 HR) 0.2 mg/hr patch Use 1/4  patch daily to the affected area 08/13/18   Gerarda Gunther, MD    Allergies    Patient has no known allergies.  Review of Systems   Review of Systems  Constitutional: Positive for diaphoresis. Negative for chills and fever.  HENT: Negative for ear pain and sore throat.   Eyes: Negative for pain and visual disturbance.  Respiratory: Negative for cough and shortness of breath.   Cardiovascular: Positive for syncope. Negative for chest pain and palpitations.  Gastrointestinal: Negative for abdominal pain and vomiting.  Genitourinary: Negative for dysuria and hematuria.  Musculoskeletal: Negative for arthralgias and back pain.  Skin: Negative for color change and rash.  Neurological: Negative for focal  weakness, seizures, syncope, weakness and headaches.  All other systems reviewed and are negative.   Physical Exam Updated Vital Signs BP 126/83 (BP Location: Left Arm)   Pulse (!) 51   Temp 99 F (37.2 C)   Resp 16   Ht 6' (1.829 m)   Wt 72.6 kg   BMI 21.70 kg/m   Physical Exam Vitals and nursing note reviewed.  Constitutional:      Appearance: He is well-developed.  HENT:     Head: Normocephalic and atraumatic.  Eyes:     Conjunctiva/sclera: Conjunctivae normal.  Cardiovascular:     Rate and Rhythm: Normal rate and regular rhythm.     Pulses: Normal pulses.     Heart sounds: Normal heart sounds. No murmur heard.   Pulmonary:     Effort: Pulmonary effort is normal. No respiratory distress.     Breath sounds: Normal breath sounds.  Abdominal:     Palpations: Abdomen is soft.     Tenderness: There is no abdominal tenderness.  Musculoskeletal:        General: No deformity.     Cervical back: Neck supple.  Skin:    General: Skin is warm and dry.  Neurological:     General: No focal deficit present.     Mental Status: He is alert and oriented to person, place, and time.     Sensory: No sensory deficit.     Motor: No weakness.     Gait: Gait normal.  Psychiatric:        Mood and Affect: Mood normal.        Behavior: Behavior normal.     ED Results / Procedures / Treatments   Labs (all labs ordered are listed, but only abnormal results are displayed) Labs Reviewed  BASIC METABOLIC PANEL - Abnormal; Notable for the following components:      Result Value   Chloride 97 (*)    All other components within normal limits  CBC - Abnormal; Notable for the following components:   WBC 18.7 (*)    All other components within normal limits  URINALYSIS, ROUTINE W REFLEX MICROSCOPIC - Abnormal; Notable for the following components:   Color, Urine COLORLESS (*)    Specific Gravity, Urine <1.005 (*)    All other components within normal limits  CBG MONITORING, ED -  Abnormal; Notable for the following components:   Glucose-Capillary 103 (*)    All other components within normal limits    EKG EKG Interpretation  Date/Time:  Tuesday Mar 05 2021 15:51:27 EDT Ventricular Rate:  47 PR Interval:  160 QRS Duration: 96 QT Interval:  408 QTC Calculation: 361 R Axis:   93 Text Interpretation: Sinus rhythm Supraventricular bigeminy Probable left atrial enlargement Borderline right axis deviation ST elev, probable normal early repol  pattern No delta waves No prior for comparison Confirmed by Pieter Partridge ([id number]) on 03/05/2021 4:02:45 PM   Radiology No results found.  Procedures Procedures   Medications Ordered in ED Medications - No data to display  ED Course  I have reviewed the triage vital signs and the nursing notes.  Pertinent labs & imaging results that were available during my care of the patient were reviewed by me and considered in my medical decision making (see chart for details).    MDM Rules/Calculators/A&P                          EDENILSON AUSTAD presents after a syncopal episode.  Despite the syncopal episode being closely associated with exercise, there were some reassuring features such as presyncopal symptoms and the fact that he passed out after going from a supine to a standing position.  Nonetheless, he is a Curator.  I think it would be best for him to have a cardiology evaluation prior to the start of his basketball season.  He is agreeable to this recommendation and will also follow-up with his athletic trainer. Final Clinical Impression(s) / ED Diagnoses Final diagnoses:  Syncope, unspecified syncope type    Rx / DC Orders ED Discharge Orders    None       Koleen Distance, MD 03/05/21 [phone]

## 2021-03-05 NOTE — ED Triage Notes (Signed)
Pt presents with syncope About 1 hours ago. Denies headache, blurred vision, or dizziness. States this happened after working out today.   States also happened in the fall.   Patient is being discharged from the Urgent Care and sent to the Emergency Department via POV with Mother . Per Colin Mulders, NP, patient is in need of higher level of care due to multiple episodes of syncope with physical activity. Patient is aware and verbalizes understanding of plan of care.  Vitals:   03/05/21 1513  BP: 132/64  Pulse: 73  Resp: 16  Temp: 98.4 F (36.9 C)  SpO2: 100%

## 2021-03-05 NOTE — ED Triage Notes (Signed)
Pt c/o of a syncopal episode today at the gym. Pt sates he was working out for apporx . Pt states he did not fall, he stood up and felt lightheaded. Pt states he did not fall. Pt states positive LOC, going in and out of consciousness for about .

## 2021-03-05 NOTE — ED Notes (Addendum)
Pt's CBG result was 103. Informed Andruw - RN.

## 2021-03-07 ENCOUNTER — Telehealth (INDEPENDENT_AMBULATORY_CARE_PROVIDER_SITE_OTHER): Payer: PRIVATE HEALTH INSURANCE | Admitting: Family Medicine

## 2021-03-07 ENCOUNTER — Other Ambulatory Visit: Payer: Self-pay

## 2021-03-07 DIAGNOSIS — R55 Syncope and collapse: Secondary | ICD-10-CM | POA: Diagnosis not present

## 2021-03-07 DIAGNOSIS — F061 Catatonic disorder due to known physiological condition: Secondary | ICD-10-CM

## 2021-03-07 NOTE — Progress Notes (Signed)
   Subjective:    Patient ID: Matthew Gill, male    DOB: 13-Aug-2001, 20 y.o.   MRN: [id number]  HPI Documentation for virtual audio  telecommunications through [facility] encounter:  The patient was located at home. 2 patient identifiers used.  The provider was located in the office. The patient did consent to this visit and is aware of possible charges through their insurance for this visit. The other persons participating in this telemedicine service were none. Time spent on call was 10 minutes and in review of previous records >20 minutes total for counseling and coordination of care. This virtual service is not related to other E/M service within previous 7 days. He describes an event that occurred the other day.  He was exercising riding a bike and then did abdominal exercises.  He then got up to move around became quite diaphoretic and states that he was unable to see mainly because his eyes closed.  He could hear people but was unable to speak to them.  When he tried to speak he made a snoring sound.  This lasted approximately 3 to 5 minutes.  He was taken to the emergency room.  The emergency room record including blood work and EKG was evaluated by me.  He does have a previous history of 1 of these episodes that occurred while he was at home.  At that time he was sedentary, again became very diaphoretic and was unable to speak.  This again lasted for several minutes and he was fine after that.  Review of Systems     Objective:   Physical Exam Alert and in no distress otherwise not examined.  Emergency room record including note, EKG and blood work was evaluated.       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  This is a second episode of this and I think neurology evaluation would be appropriate. Okay to return to physical activity.

## 2021-03-14 ENCOUNTER — Ambulatory Visit (INDEPENDENT_AMBULATORY_CARE_PROVIDER_SITE_OTHER): Payer: 59

## 2021-03-14 ENCOUNTER — Telehealth: Payer: Self-pay

## 2021-03-14 ENCOUNTER — Ambulatory Visit (INDEPENDENT_AMBULATORY_CARE_PROVIDER_SITE_OTHER): Payer: 59 | Admitting: Cardiology

## 2021-03-14 ENCOUNTER — Encounter: Payer: Self-pay | Admitting: Cardiology

## 2021-03-14 ENCOUNTER — Other Ambulatory Visit: Payer: Self-pay

## 2021-03-14 ENCOUNTER — Telehealth: Payer: Self-pay | Admitting: Family Medicine

## 2021-03-14 DIAGNOSIS — R55 Syncope and collapse: Secondary | ICD-10-CM

## 2021-03-14 DIAGNOSIS — R011 Cardiac murmur, unspecified: Secondary | ICD-10-CM

## 2021-03-14 DIAGNOSIS — G47411 Narcolepsy with cataplexy: Secondary | ICD-10-CM

## 2021-03-14 HISTORY — DX: Cardiac murmur, unspecified: R01.1

## 2021-03-14 HISTORY — DX: Syncope and collapse: R55

## 2021-03-14 NOTE — Progress Notes (Signed)
Cardiology Office Note:    Date:  03/14/2021   ID:  Matthew Gill, DOB [birth year], MRN [id number]  PCP:  Matthew Bathe, MD  Cardiologist:  Matthew Brothers, MD   Referring MD: Matthew Bathe, MD    ASSESSMENT:    1. Syncope, unspecified syncope type   2. Cardiac murmur    PLAN:    In order of problems listed above:  1. Primary prevention stressed to the patient.  Importance of compliance with diet stressed.  I told him to  eat some and keep well-hydrated before his practice sessions. 2. Syncope: Fall precautions were visited.  I told him to get in touch with his primary care about resuming driving.  In view of his symptoms I will get him scheduled for an exercise stress echo.  Overall his cardiac exam was unremarkable we will also do an echocardiogram to assess murmur heard on auscultation.  We will do a 2-week monitor to see if there is any arrhythmias contributing for this. 3. Murmur: Echocardiogram to assess murmur 4. Patient will be seen in follow-up appointment in 6 months or earlier if the patient has any concerns    Medication Adjustments/Labs and Tests Ordered: Current medicines are reviewed at length with the patient today.  Concerns regarding medicines are outlined above.  Orders Placed This Encounter  Procedures  . MYOCARDIAL PERFUSION IMAGING  . LONG TERM MONITOR (3-14 DAYS)  . ECHOCARDIOGRAM COMPLETE   No orders of the defined types were placed in this encounter.    History of Present Illness:    Matthew Gill is a 20 y.o. male who is being seen today for the evaluation of syncope at the request of Matthew Bathe, MD.  Patient is a pleasant 20 year old male.  He has no significant past medical history.  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  A few days ago the patient mentions to me that he had episode cramping in his abdomen during his practice sessions and subsequently had a syncopal spell.  This was witnessed by his colleagues.  Patient mentions to me  though he had passed out he remembers hearing people talking around him.he has not had a similar episode in the past. At the time of my evaluation, the patient is alert awake oriented and in no distress.  Past Medical History:  Diagnosis Date  . Acute lateral meniscus tear of right knee 01/14/2018  . Medical history non-contributory     Past Surgical History:  Procedure Laterality Date  . KNEE ARTHROSCOPY WITH LATERAL MENISECTOMY Right 01/15/2018   Procedure: RIGHT KNEE ARTHROSCOPY WITH PARTIAL LATERAL MENISECTOMY;  Surgeon: Salvatore Marvel, MD;  Location: [facility];  Service: Orthopedics;  Laterality: Right;  . NO PAST SURGERIES      Current Medications: No outpatient medications have been marked as taking for the 03/14/21 encounter (Office Visit) with Dinara Lupu, Aundra Dubin, MD.     Allergies:   Patient has no known allergies.   Social History   Socioeconomic History  . Marital status: Single    Spouse name: Not on file  . Number of children: Not on file  . Years of education: Not on file  . Highest education level: Not on file  Occupational History  . Not on file  Tobacco Use  . Smoking status: Never Smoker  . Smokeless tobacco: Never Used  Substance and Sexual Activity  . Alcohol use: No  . Drug use: No  . Sexual activity: Not on file  Other Topics Concern  .  Not on file  Social History Narrative  . Not on file   Social Determinants of Health   Financial Resource Strain: Not on file  Food Insecurity: Not on file  Transportation Needs: Not on file  Physical Activity: Not on file  Stress: Not on file  Social Connections: Not on file     Family History: The patient's family history is negative for Sudden death and Heart attack.  ROS:   Please see the history of present illness.    All other systems reviewed and are negative.  EKGs/Labs/Other Studies Reviewed:    The following studies were reviewed today: EKG reveals sinus rhythm and early  repolarization   Recent Labs: 03/05/2021: BUN 9; Creatinine, Ser 1.09; Hemoglobin 14.9; Platelets 230; Potassium 3.8; Sodium 136  Recent Lipid Panel No results found for: CHOL, TRIG, HDL, CHOLHDL, VLDL, LDLCALC, LDLDIRECT  Physical Exam:    VS:  BP 118/74   Pulse (!) 56   Ht 6\' 1"  (1.854 m)   Wt 162 lb 0.6 oz (73.5 kg)   SpO2 98%   BMI 21.38 kg/m     Wt Readings from Last 3 Encounters:  03/14/21 162 lb 0.6 oz (73.5 kg)  03/05/21 160 lb (72.6 kg)  08/27/18 165 lb (74.8 kg) (69 %, Z= 0.49)*   * Growth percentiles are based on CDC (Boys, 2-20 Years) data.     GEN: Patient is in no acute distress HEENT: Normal NECK: No JVD; No carotid bruits LYMPHATICS: No lymphadenopathy CARDIAC: S1 S2 regular, 2/6 systolic murmur at the apex. RESPIRATORY:  Clear to auscultation without rales, wheezing or rhonchi  ABDOMEN: Soft, non-tender, non-distended MUSCULOSKELETAL:  No edema; No deformity  SKIN: Warm and dry NEUROLOGIC:  Alert and oriented x 3 PSYCHIATRIC:  Normal affect    Signed, 08/29/18, MD  03/14/2021 2:33 PM    [facility]

## 2021-03-14 NOTE — Patient Instructions (Signed)
Medication Instructions:  Your physician recommends that you continue on your current medications as directed. Please refer to the Current Medication list given to you today. *If you need a refill on your cardiac medications before your next appointment, please call your pharmacy*   Lab Work: None ordered If you have labs (blood work) drawn today and your tests are completely normal, you will receive your results only by: Marland Kitchen MyChart Message (if you have MyChart) OR . A paper copy in the mail If you have any lab test that is abnormal or we need to change your treatment, we will call you to review the results.   Testing/Procedures: Your physician has requested that you have a lexiscan myoview. For further information please visit [email or web address] Please follow instruction sheet, as given.  The test will take approximately 3 to 4 hours to complete; you may bring reading material.  If someone comes with you to your appointment, they will need to remain in the main lobby due to limited space in the testing area. **If you are pregnant or breastfeeding, please notify the nuclear lab prior to your appointment**  How to prepare for your Myocardial Perfusion Test: . Do not eat or drink 3 hours prior to your test, except you may have water. . Do not consume products containing caffeine (regular or decaffeinated) 12 hours prior to your test. (ex: coffee, chocolate, sodas, tea). . Do bring a list of your current medications with you.  If not listed below, you may take your medications as normal. . Do wear comfortable clothes (no dresses or overalls) and walking shoes, tennis shoes preferred (No heels or open toe shoes are allowed). . Do NOT wear cologne, perfume, aftershave, or lotions (deodorant is allowed). . If these instructions are not followed, your test will have to be rescheduled.  Your physician has requested that you have an echocardiogram. Echocardiography is a painless test that uses  sound waves to create images of your heart. It provides your doctor with information about the size and shape of your heart and how well your heart's chambers and valves are working. This procedure takes approximately one hour. There are no restrictions for this procedure.   WHY IS MY DOCTOR PRESCRIBING ZIO? The Zio system is proven and trusted by physicians to detect and diagnose irregular heart rhythms -- and has been prescribed to hundreds of thousands of patients.  The FDA has cleared the Zio system to monitor for many different kinds of irregular heart rhythms. In a study, physicians were able to reach a diagnosis 90% of the time with the Zio system1.  You can wear the Zio monitor -- a small, discreet, comfortable patch -- during your normal day-to-day activity, including while you sleep, shower, and exercise, while it records every single heartbeat for analysis.  1Barrett, P., et al. Comparison of 24 Hour Holter Monitoring Versus 14 Day Novel Adhesive Patch Electrocardiographic Monitoring. American Journal of Medicine, 2014.  ZIO VS. HOLTER MONITORING The Zio monitor can be comfortably worn for up to 14 days. Holter monitors can be worn for 24 to 48 hours, limiting the time to record any irregular heart rhythms you may have. Zio is able to capture data for the 51% of patients who have their first symptom-triggered arrhythmia after 48 hours.1  LIVE WITHOUT RESTRICTIONS The Zio ambulatory cardiac monitor is a small, unobtrusive, and water-resistant patch--you might even forget you're wearing it. The Zio monitor records and stores every beat of your heart, whether you're sleeping, working out,  or showering.  Wear the monitor for 2 weeks, remove 03/28/21.  Follow-Up: At [facility], you and your health needs are our priority.  As part of our continuing mission to provide you with exceptional heart care, we have created designated Provider Care Teams.  These Care Teams include your primary  Cardiologist (physician) and Advanced Practice Providers (APPs -  Physician Assistants and Nurse Practitioners) who all work together to provide you with the care you need, when you need it.  We recommend signing up for the patient portal called "MyChart".  Sign up information is provided on this After Visit Summary.  MyChart is used to connect with patients for Virtual Visits (Telemedicine).  Patients are able to view lab/test results, encounter notes, upcoming appointments, etc.  Non-urgent messages can be sent to your provider as well.   To learn more about what you can do with MyChart, go to [email or web address].    Your next appointment:   3 month(s)  The format for your next appointment:   In Person  Provider:   Belva Cromeajan Revankar, MD   Other Instructions Cardiac Nuclear Scan A cardiac nuclear scan is a test that is done to check the flow of blood to your heart. It is done when you are resting and when you are exercising. The test looks for problems such as:  Not enough blood reaching a portion of the heart.  The heart muscle not working as it should. You may need this test if:  You have heart disease.  You have had lab results that are not normal.  You have had heart surgery or a balloon procedure to open up blocked arteries (angioplasty).  You have chest pain.  You have shortness of breath. In this test, a special dye (tracer) is put into your bloodstream. The tracer will travel to your heart. A camera will then take pictures of your heart to see how the tracer moves through your heart. This test is usually done at a hospital and takes 2-4 hours. Tell a doctor about:  Any allergies you have.  All medicines you are taking, including vitamins, herbs, eye drops, creams, and over-the-counter medicines.  Any problems you or family members have had with anesthetic medicines.  Any blood disorders you have.  Any surgeries you have had.  Any medical conditions you  have.  Whether you are pregnant or may be pregnant. What are the risks? Generally, this is a safe test. However, problems may occur, such as:  Serious chest pain and heart attack. This is only a risk if the stress portion of the test is done.  Rapid heartbeat.  A feeling of warmth in your chest. This feeling usually does not last long.  Allergic reaction to the tracer. What happens before the test?  Ask your doctor about changing or stopping your normal medicines. This is important.  Follow instructions from your doctor about what you cannot eat or drink.  Remove your jewelry on the day of the test. What happens during the test? 1. An IV tube will be inserted into one of your veins. 2. Your doctor will give you a small amount of tracer through the IV tube. 3. You will wait for 20-40 minutes while the tracer moves through your bloodstream. 4. Your heart will be monitored with an electrocardiogram (ECG). 5. You will lie down on an exam table. 6. Pictures of your heart will be taken for about 15-20 minutes. 7. You may also have a stress test. For this test,  one of these things may be done: ? You will be asked to exercise on a treadmill or a stationary bike. ? You will be given medicines that will make your heart work harder. This is done if you are unable to exercise. 8. When blood flow to your heart has peaked, a tracer will again be given through the IV tube. 9. After 20-40 minutes, you will get back on the exam table. More pictures will be taken of your heart. 10. Depending on the tracer that is used, more pictures may need to be taken 3-4 hours later. 11. Your IV tube will be removed when the test is over. The test may vary among doctors and hospitals. What happens after the test? 1. Ask your doctor: ? Whether you can return to your normal schedule, including diet, activities, and medicines. ? Whether you should drink more fluids. This will help to remove the tracer from your  body. Drink enough fluid to keep your pee (urine) pale yellow. 2. Ask your doctor, or the department that is doing the test: ? When will my results be ready? ? How will I get my results? Summary  A cardiac nuclear scan is a test that is done to check the flow of blood to your heart.  Tell your doctor whether you are pregnant or may be pregnant.  Before the test, ask your doctor about changing or stopping your normal medicines. This is important.  Ask your doctor whether you can return to your normal activities. You may be asked to drink more fluids. This information is not intended to replace advice given to you by your health care provider. Make sure you discuss any questions you have with your health care provider. Document Revised: 01/19/2019 Document Reviewed: 03/15/2018 Elsevier Patient Education  2021 Elsevier Inc.    Echocardiogram An echocardiogram is a test that uses sound waves (ultrasound) to produce images of the heart. Images from an echocardiogram can provide important information about:  Heart size and shape.  The size and thickness and movement of your heart's walls.  Heart muscle function and strength.  Heart valve function or if you have stenosis. Stenosis is when the heart valves are too narrow.  If blood is flowing backward through the heart valves (regurgitation).  A tumor or infectious growth around the heart valves.  Areas of heart muscle that are not working well because of poor blood flow or injury from a heart attack.  Aneurysm detection. An aneurysm is a weak or damaged part of an artery wall. The wall bulges out from the normal force of blood pumping through the body. Tell a health care provider about:  Any allergies you have.  All medicines you are taking, including vitamins, herbs, eye drops, creams, and over-the-counter medicines.  Any blood disorders you have.  Any surgeries you have had.  Any medical conditions you have.  Whether you  are pregnant or may be pregnant. What are the risks? Generally, this is a safe test. However, problems may occur, including an allergic reaction to dye (contrast) that may be used during the test. What happens before the test? No specific preparation is needed. You may eat and drink normally. What happens during the test?  You will take off your clothes from the waist up and put on a hospital gown.  Electrodes or electrocardiogram (ECG)patches may be placed on your chest. The electrodes or patches are then connected to a device that monitors your heart rate and rhythm.  You will  lie down on a table for an ultrasound exam. A gel will be applied to your chest to help sound waves pass through your skin.  A handheld device, called a transducer, will be pressed against your chest and moved over your heart. The transducer produces sound waves that travel to your heart and bounce back (or "echo" back) to the transducer. These sound waves will be captured in real-time and changed into images of your heart that can be viewed on a video monitor. The images will be recorded on a computer and reviewed by your health care provider.  You may be asked to change positions or hold your breath for a short time. This makes it easier to get different views or better views of your heart.  In some cases, you may receive contrast through an IV in one of your veins. This can improve the quality of the pictures from your heart. The procedure may vary among health care providers and hospitals.    What can I expect after the test? You may return to your normal, everyday life, including diet, activities, and medicines, unless your health care provider tells you not to do that. Follow these instructions at home:  It is up to you to get the results of your test. Ask your health care provider, or the department that is doing the test, when your results will be ready.  Keep all follow-up visits. This is  important. Summary  An echocardiogram is a test that uses sound waves (ultrasound) to produce images of the heart.  Images from an echocardiogram can provide important information about the size and shape of your heart, heart muscle function, heart valve function, and other possible heart problems.  You do not need to do anything to prepare before this test. You may eat and drink normally.  After the echocardiogram is completed, you may return to your normal, everyday life, unless your health care provider tells you not to do that. This information is not intended to replace advice given to you by your health care provider. Make sure you discuss any questions you have with your health care provider. Document Revised: 05/22/2020 Document Reviewed: 05/22/2020 Elsevier Patient Education  2021 [employer].

## 2021-03-14 NOTE — Telephone Encounter (Signed)
New referral was put in for neurology to [facility]. [facility] also added pt to cancellation list. Diane also will make a call over to [facility]. KH

## 2021-03-14 NOTE — Telephone Encounter (Signed)
Called Blair and let him know, I had reached out to [facility] since they denied the referral.  The referral has been re-sent to [facility] and I have sent an email to the manager to request urgent referral.  I advised him to watch for a call from [facility] and he said ok.

## 2021-03-18 NOTE — Telephone Encounter (Signed)
[facility] Manager emailed today, advised working to move his appointment up.

## 2021-03-19 NOTE — Telephone Encounter (Signed)
[facility] is requesting physiciatry first.  Note sent to Dr Susann Givens.

## 2021-03-20 ENCOUNTER — Telehealth (HOSPITAL_COMMUNITY): Payer: Self-pay | Admitting: *Deleted

## 2021-03-20 ENCOUNTER — Encounter (HOSPITAL_COMMUNITY): Payer: Self-pay | Admitting: *Deleted

## 2021-03-20 NOTE — Telephone Encounter (Signed)
Attempted to call patient regarding upcoming appointment- no answer. Letter sent with instructions via my chart.  Matthew Gill

## 2021-03-27 ENCOUNTER — Ambulatory Visit (HOSPITAL_BASED_OUTPATIENT_CLINIC_OR_DEPARTMENT_OTHER)
Admission: RE | Admit: 2021-03-27 | Discharge: 2021-03-27 | Disposition: A | Discharge status: Home / Self Care | Payer: 59 | Source: Ambulatory Visit | Attending: Cardiology | Admitting: Cardiology

## 2021-03-27 ENCOUNTER — Other Ambulatory Visit: Payer: Self-pay

## 2021-03-27 DIAGNOSIS — R55 Syncope and collapse: Secondary | ICD-10-CM | POA: Diagnosis not present

## 2021-03-27 LAB — ECHOCARDIOGRAM COMPLETE
Area-P 1/2: 1.51 cm2
S' Lateral: 2.94 cm

## 2021-03-29 ENCOUNTER — Other Ambulatory Visit: Payer: Self-pay

## 2021-03-29 ENCOUNTER — Ambulatory Visit (HOSPITAL_COMMUNITY): Payer: 59 | Attending: Internal Medicine

## 2021-03-29 DIAGNOSIS — R55 Syncope and collapse: Secondary | ICD-10-CM | POA: Diagnosis not present

## 2021-03-29 LAB — MYOCARDIAL PERFUSION IMAGING
LV dias vol: 178 mL (ref 62–150)
LV sys vol: 109 mL
Peak HR: 116 {beats}/min
Rest HR: 48 {beats}/min
SDS: 4
SRS: 3
SSS: 7
TID: 1.03

## 2021-03-29 MED ORDER — TECHNETIUM TC 99M TETROFOSMIN IV KIT
30.6000 | PACK | Freq: Once | INTRAVENOUS | Status: AC | PRN
  Administered 2021-03-29: 30.6 via INTRAVENOUS
  Filled 2021-03-29: qty 31

## 2021-03-29 MED ORDER — REGADENOSON 0.4 MG/5ML IV SOLN
0.4000 mg | Freq: Once | INTRAVENOUS | Status: AC
  Administered 2021-03-29: 0.4 mg via INTRAVENOUS

## 2021-03-29 MED ORDER — TECHNETIUM TC 99M TETROFOSMIN IV KIT
10.2000 | PACK | Freq: Once | INTRAVENOUS | Status: AC | PRN
  Administered 2021-03-29: 10.2 via INTRAVENOUS
  Filled 2021-03-29: qty 11

## 2021-04-02 ENCOUNTER — Telehealth: Payer: Self-pay

## 2021-04-02 DIAGNOSIS — R55 Syncope and collapse: Secondary | ICD-10-CM | POA: Diagnosis not present

## 2021-04-02 NOTE — Telephone Encounter (Signed)
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

## 2021-04-09 DIAGNOSIS — U071 COVID-19: Secondary | ICD-10-CM | POA: Diagnosis not present

## 2021-06-03 DIAGNOSIS — Z789 Other specified health status: Secondary | ICD-10-CM | POA: Insufficient documentation

## 2021-06-04 ENCOUNTER — Ambulatory Visit: Payer: No Typology Code available for payment source | Admitting: Cardiology

## 2021-06-04 ENCOUNTER — Encounter: Payer: Self-pay | Admitting: Cardiology

## 2021-06-05 ENCOUNTER — Encounter: Payer: Self-pay | Admitting: Cardiology

## 2021-06-07 ENCOUNTER — Ambulatory Visit: Payer: No Typology Code available for payment source | Admitting: Cardiology

## 2021-06-11 DIAGNOSIS — Z113 Encounter for screening for infections with a predominantly sexual mode of transmission: Secondary | ICD-10-CM | POA: Diagnosis not present

## 2021-06-11 DIAGNOSIS — Z7189 Other specified counseling: Secondary | ICD-10-CM | POA: Diagnosis not present

## 2021-08-12 ENCOUNTER — Ambulatory Visit (HOSPITAL_BASED_OUTPATIENT_CLINIC_OR_DEPARTMENT_OTHER)
Admission: RE | Admit: 2021-08-12 | Discharge: 2021-08-12 | Disposition: A | Discharge status: Home / Self Care | Payer: 59 | Source: Ambulatory Visit | Attending: Orthopaedic Surgery | Admitting: Orthopaedic Surgery

## 2021-08-12 ENCOUNTER — Ambulatory Visit (INDEPENDENT_AMBULATORY_CARE_PROVIDER_SITE_OTHER): Payer: 59 | Admitting: Orthopaedic Surgery

## 2021-08-12 ENCOUNTER — Other Ambulatory Visit: Payer: Self-pay

## 2021-08-12 ENCOUNTER — Other Ambulatory Visit (HOSPITAL_BASED_OUTPATIENT_CLINIC_OR_DEPARTMENT_OTHER): Payer: Self-pay | Admitting: Orthopaedic Surgery

## 2021-08-12 DIAGNOSIS — M25561 Pain in right knee: Secondary | ICD-10-CM | POA: Insufficient documentation

## 2021-08-12 DIAGNOSIS — G8929 Other chronic pain: Secondary | ICD-10-CM

## 2021-08-12 NOTE — Progress Notes (Signed)
Chief Complaint: Pain     History of Present Illness:    Matthew Gill is a 20 y.o. male with right knee pain after hyperextending injury on Friday practice prior to presentation.  He states that he does have a history of lateral meniscectomy performed by Dr. Thurston Hole after a similar injury.  At this time he is complaining predominantly of medial based knee pain.  He denies any instability or effusion.  He has been able to put weight on the leg as tolerated.  He says that this is comparable to the meniscal injury that he had before.  Denies any previous history of symptomatic Osgood-Schlatter.  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    Surgical History:   None  PMH/PSH/Family History/Social History/Meds/Allergies:    Past Medical History:  Diagnosis Date   Acute lateral meniscus tear of right knee 01/14/2018   Cardiac murmur 03/14/2021   Medical history non-contributory    Sports physical 06/10/2017   Syncope 03/14/2021   Past Surgical History:  Procedure Laterality Date   KNEE ARTHROSCOPY WITH LATERAL MENISECTOMY Right 01/15/2018   Procedure: RIGHT KNEE ARTHROSCOPY WITH PARTIAL LATERAL MENISECTOMY;  Surgeon: Salvatore Marvel, MD;  Location: [facility];  Service: Orthopedics;  Laterality: Right;   NO PAST SURGERIES     Social History   Socioeconomic History   Marital status: Single    Spouse name: Not on file   Number of children: Not on file   Years of education: Not on file   Highest education level: Not on file  Occupational History   Not on file  Tobacco Use   Smoking status: Never   Smokeless tobacco: Never  Substance and Sexual Activity   Alcohol use: No   Drug use: No   Sexual activity: Not on file  Other Topics Concern   Not on file  Social History Narrative   Not on file   Social Determinants of Health   Financial Resource Strain: Not on file  Food Insecurity: Not on file  Transportation Needs:  Not on file  Physical Activity: Not on file  Stress: Not on file  Social Connections: Not on file   Family History  Problem Relation Age of Onset   Sudden death Neg Hx    Heart attack Neg Hx    No Known Allergies No current outpatient medications on file.   No current facility-administered medications for this visit.   No results found.  Review of Systems:   A ROS was performed including pertinent positives and negatives as documented in the HPI.  Physical Exam :   Constitutional: NAD and appears stated age Neurological: Alert and oriented Psych: Appropriate affect and cooperative There were no vitals taken for this visit.   Comprehensive Musculoskeletal Exam:      Musculoskeletal Exam  Gait Normal  Alignment Normal   Right Left  Inspection Normal Normal  Palpation    Tenderness Medial joint None  Crepitus None None  Effusion Trace None  Range of Motion    Extension 0 0  Flexion 135 135  Strength    Extension 5/5 5/5  Flexion 5/5 5/5  Ligament Exam     Generalized Laxity No No  Lachman Negative Negative   Pivot Shift Negative Negative  Anterior Drawer Negative Negative  Valgus at 0 Negative Negative  Valgus at 20 Negative Negative  Varus at 0 0 0  Varus at 20   0 0  Posterior Drawer at 90 0 0  Vascular/Lymphatic Exam    Edema None None  Venous Stasis Changes No No  Distal Circulation Normal Normal  Neurologic    Light Touch Sensation Intact Intact  Special Tests:      Imaging:   Xray (right knee 4 view): Normal   I personally reviewed and interpreted the radiographs.   Assessment:   20 year old male with right knee pain after hyperextension injury during basketball.  I described that this time and concerned about an acute meniscal injury.  At this time I would like to obtain a stat MRI of the right knee in order to rule out any type of acute meniscal injury.  We will obtain this as soon as possible given the implications that this could  ultimately have on the upcoming season.  I will see him back in training room following this discuss results  Plan :    -Plan for stat MRI of the right knee  I believe that advance imaging in the form of an MRI is indicated for the following reasons: -Xrays images were obtained and not diagnostic -The patient has failed treatment modalities including NSAIDs, rest, ice -The following worrisome symptoms are present on history and exam: Mechanical findings including popping and    I personally saw and evaluated the patient, and participated in the management and treatment plan.  Huel Cote, MD Attending Physician, Orthopedic Surgery  This document was dictated using Dragon voice recognition software. A reasonable attempt at proof reading has been made to minimize errors.

## 2021-08-23 ENCOUNTER — Other Ambulatory Visit: Payer: 59

## 2021-09-01 ENCOUNTER — Other Ambulatory Visit: Payer: 59

## 2022-04-11 ENCOUNTER — Ambulatory Visit (INDEPENDENT_AMBULATORY_CARE_PROVIDER_SITE_OTHER): Payer: 59

## 2022-04-11 ENCOUNTER — Ambulatory Visit (INDEPENDENT_AMBULATORY_CARE_PROVIDER_SITE_OTHER): Payer: 59 | Admitting: Orthopaedic Surgery

## 2022-04-11 DIAGNOSIS — M84375A Stress fracture, left foot, initial encounter for fracture: Secondary | ICD-10-CM

## 2022-04-11 DIAGNOSIS — M79672 Pain in left foot: Secondary | ICD-10-CM

## 2022-04-11 NOTE — Progress Notes (Signed)
Chief Complaint: Pain     History of Present Illness:   04/11/2022: Presents today for follow-up of his left foot.  He continues to experience pain over the plantar aspect of the left heel.  He has been working on treatment with his athletic trainer working on Achilles stretching but continues to have this pain.  Here today for further assessment   Surgical History:   None  PMH/PSH/Family History/Social History/Meds/Allergies:    Past Medical History:  Diagnosis Date   Acute lateral meniscus tear of right knee 01/14/2018   Cardiac murmur 03/14/2021   Medical history non-contributory    Sports physical 06/10/2017   Syncope 03/14/2021   Past Surgical History:  Procedure Laterality Date   KNEE ARTHROSCOPY WITH LATERAL MENISECTOMY Right 01/15/2018   Procedure: RIGHT KNEE ARTHROSCOPY WITH PARTIAL LATERAL MENISECTOMY;  Surgeon: Salvatore Marvel, MD;  Location: [facility];  Service: Orthopedics;  Laterality: Right;   NO PAST SURGERIES     Social History   Socioeconomic History   Marital status: Single    Spouse name: Not on file   Number of children: Not on file   Years of education: Not on file   Highest education level: Not on file  Occupational History   Not on file  Tobacco Use   Smoking status: Never   Smokeless tobacco: Never  Substance and Sexual Activity   Alcohol use: No   Drug use: No   Sexual activity: Not on file  Other Topics Concern   Not on file  Social History Narrative   Not on file   Social Determinants of Health   Financial Resource Strain: Not on file  Food Insecurity: Not on file  Transportation Needs: Not on file  Physical Activity: Not on file  Stress: Not on file  Social Connections: Not on file   Family History  Problem Relation Age of Onset   Sudden death Neg Hx    Heart attack Neg Hx    No Known Allergies No current outpatient medications on file.   No current facility-administered  medications for this visit.   No results found.  Review of Systems:   A ROS was performed including pertinent positives and negatives as documented in the HPI.  Physical Exam :   Constitutional: NAD and appears stated age Neurological: Alert and oriented Psych: Appropriate affect and cooperative There were no vitals taken for this visit.   Comprehensive Musculoskeletal Exam:     Tender to palpation at the left medial insertion of the plantar fascia.  Otherwise full strength and sensation.  He has a tight heel cord with 10 degrees of passive dorsiflexion.  2+ dorsalis pedis pulse    Imaging:   Xray (left foot 3 view): Normal   I personally reviewed and interpreted the radiographs.   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  To that effect I would like to recommend an MRI so that we can rule out any type of underlying stress reaction.  We will plan on doing this and if this is negative I will plan for referral to Dr. Logan Bores for shock pulse therapy of the left foot  Plan :    -  Plan for left foot MRI   I personally saw and evaluated the patient, and participated in the management and treatment plan.  Huel Cote, MD Attending Physician, Orthopedic Surgery  This document was dictated using Dragon voice recognition software. A reasonable attempt at proof reading has been made to minimize errors.

## 2022-04-13 ENCOUNTER — Ambulatory Visit
Admission: RE | Admit: 2022-04-13 | Discharge: 2022-04-13 | Disposition: A | Discharge status: Home / Self Care | Payer: 59 | Source: Ambulatory Visit | Attending: Orthopaedic Surgery | Admitting: Orthopaedic Surgery

## 2022-04-13 DIAGNOSIS — S99922A Unspecified injury of left foot, initial encounter: Secondary | ICD-10-CM | POA: Diagnosis not present

## 2022-04-13 DIAGNOSIS — M79672 Pain in left foot: Secondary | ICD-10-CM | POA: Diagnosis not present

## 2022-04-13 DIAGNOSIS — M84375A Stress fracture, left foot, initial encounter for fracture: Secondary | ICD-10-CM

## 2022-05-11 IMAGING — DX DG KNEE COMPLETE 4+V*R*
4 series · 4 of 4 positions shown · non-contrast
Comparison: None.

CLINICAL DATA: Right knee pain

EXAM:
RIGHT KNEE - COMPLETE 4+ VIEW

[knee ap]
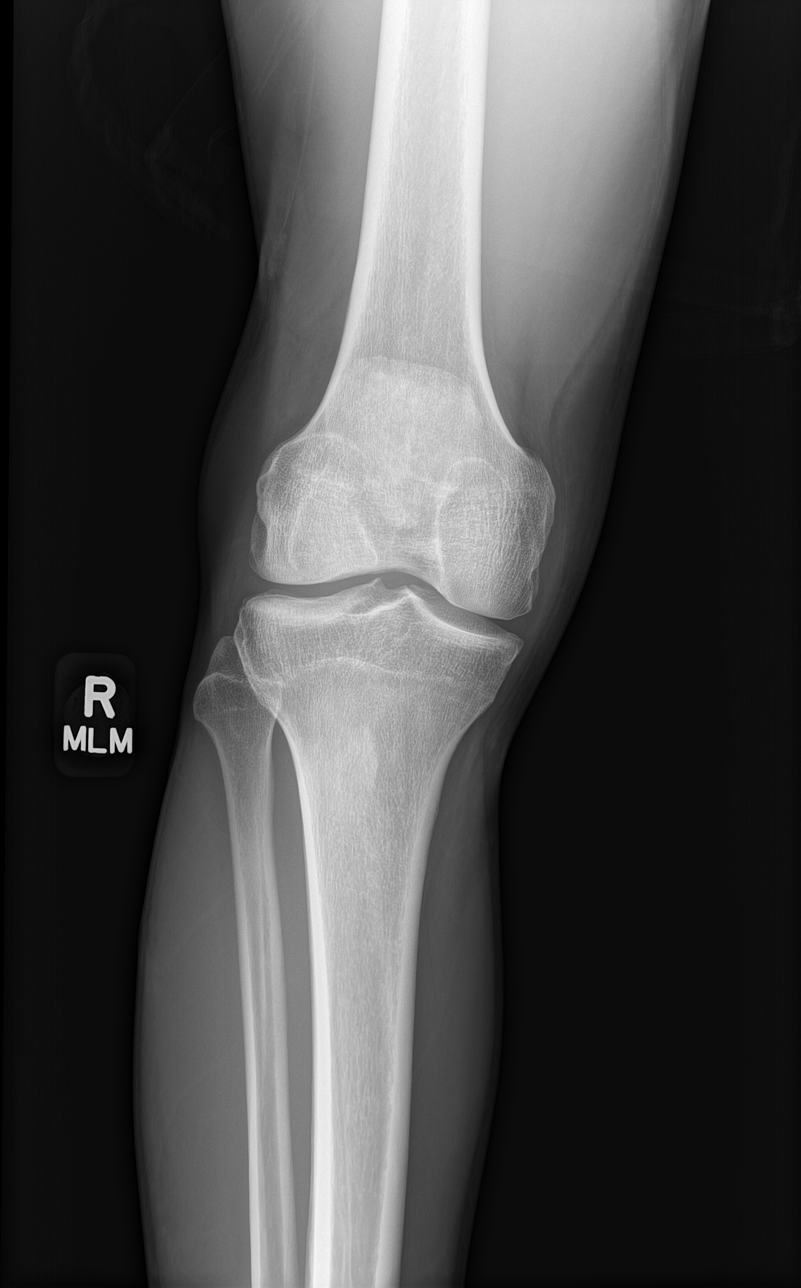

[knee lat]
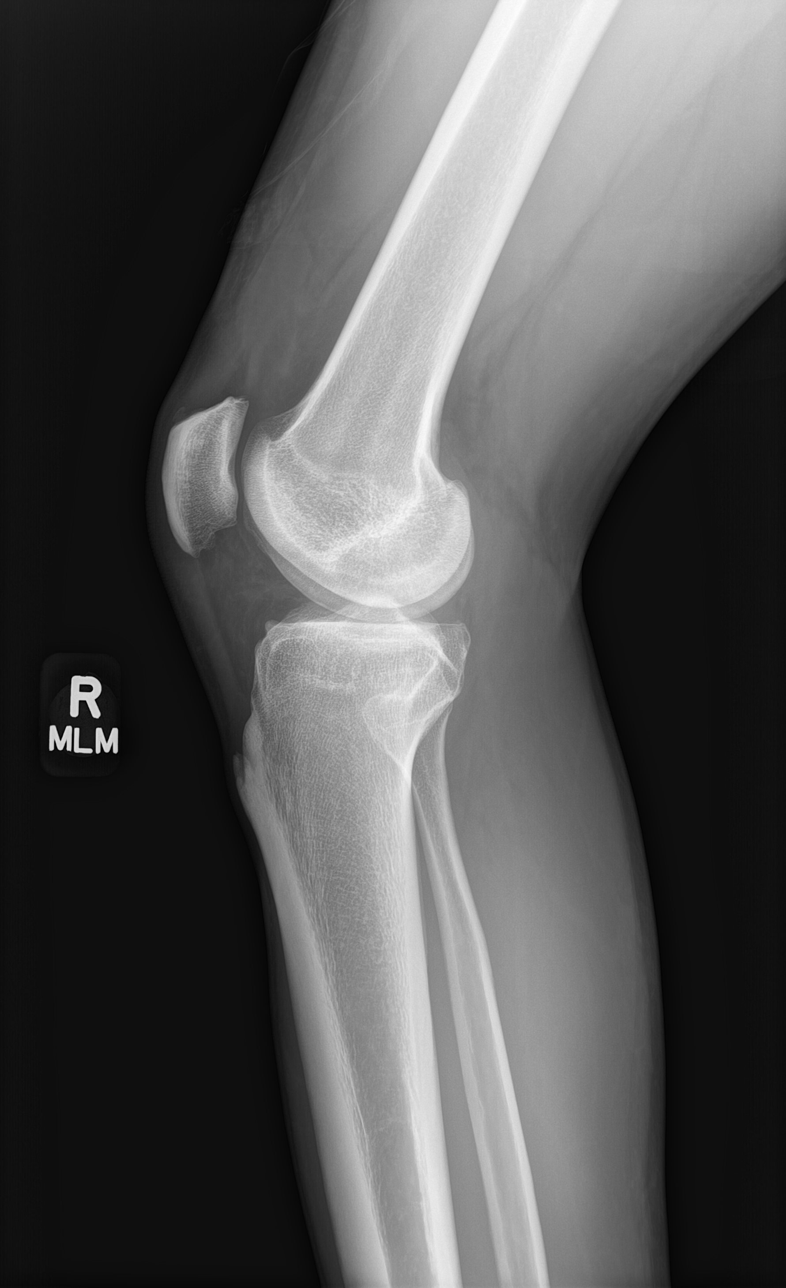

[patella skyline]
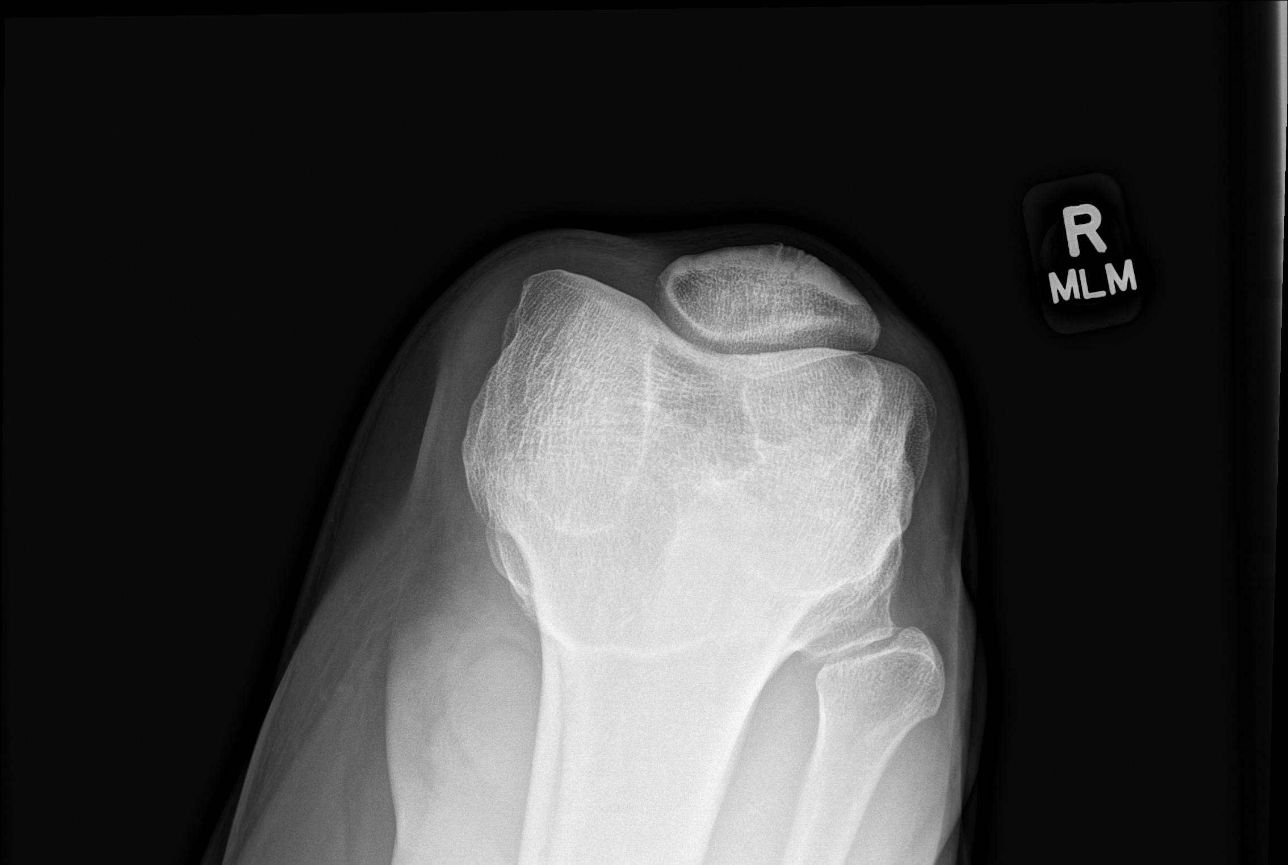

[knee tunnel]
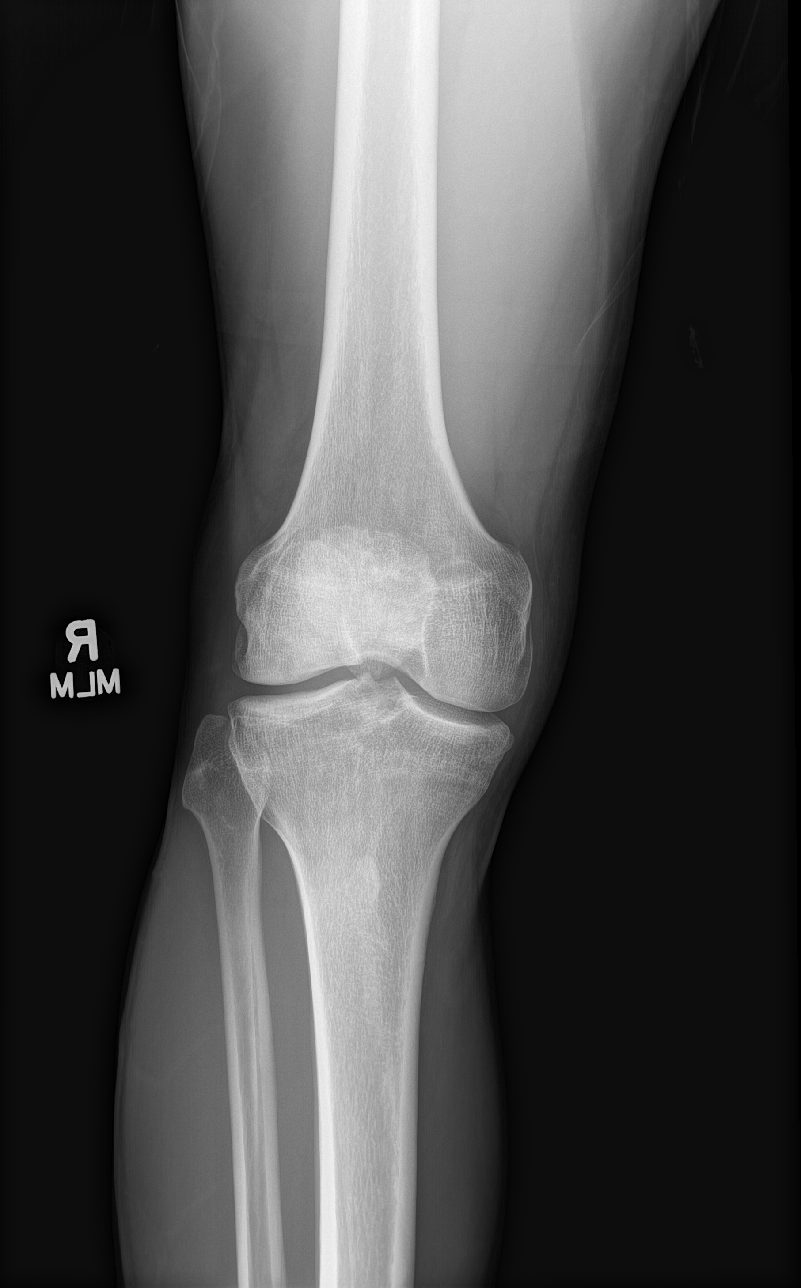

[4 of 4 positions shown; findings below may reference images not displayed]

FINDINGS: No acute fracture or dislocation identified. Mild narrowing the
patellofemoral joint. Small suprapatellar density likely represents
joint effusion.
IMPRESSION: No acute osseous abnormality identified. Likely small joint
effusion.

## 2022-11-14 ENCOUNTER — Other Ambulatory Visit: Payer: Self-pay | Admitting: Sports Medicine

## 2022-11-14 ENCOUNTER — Encounter: Payer: Self-pay | Admitting: Sports Medicine

## 2022-11-14 MED ORDER — AMOXICILLIN-POT CLAVULANATE 875-125 MG PO TABS: 1.0000 | ORAL_TABLET | Freq: Two times a day (BID) | ORAL | 0 refills | Status: AC

## 2022-11-14 NOTE — Progress Notes (Signed)
  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  No injury.  Started initially as some swelling and a thin ropelike structure extending from the axilla down to the elbow.  Has been there for the last 2-3 weeks.  It is tender to palpation.  Over the last short while there have formed to tender nodules that are palpable.  Denies any recent illnesses.  No fever or chills, no weight loss.  No weakness to the arm.  Assessment and Plan:   Axillary and left bicep lymphadenopathy -likely reactive from viral etiology although patient denies any recent illness.  No red flag symptoms on exam.  Will start with a trial of Augmentin twice daily x 7 days, will take anti-inflammatories twice daily over the same time period.  Would expect this to start to resolve at the end of this course.  If it does not, would likely pursue diagnostic ultrasound first.  Comprehensive Musculoskeletal Exam:    Left arm: There are 2 small pea to marble size nodules palpable over the medial aspect of the upper arm between the bicep and tricep tendons.  There are a few scattered enlarged lymph nodes palpable within the axilla.  These are all tender to palpation and mobile about the skin.  No surrounding redness.  Full range of motion about the shoulder and elbow.  Imaging:    Hold for now; could consider diagnostic ultrasound if not improving as above  Elba Barman, DO [place]  This document was dictated using Dragon voice recognition software. A reasonable attempt at proof reading has been made to minimize errors.

## 2023-05-29 ENCOUNTER — Other Ambulatory Visit (HOSPITAL_BASED_OUTPATIENT_CLINIC_OR_DEPARTMENT_OTHER): Payer: Self-pay

## 2023-05-29 MED ORDER — ZOLPIDEM TARTRATE 10 MG PO TABS
10.0000 mg | ORAL_TABLET | Freq: Every evening | ORAL | 0 refills | Status: AC
  Filled 2023-05-29: qty 5, 5d supply, fill #0

## 2024-02-26 DIAGNOSIS — Z13220 Encounter for screening for lipoid disorders: Secondary | ICD-10-CM | POA: Diagnosis not present

## 2024-02-26 DIAGNOSIS — Z0001 Encounter for general adult medical examination with abnormal findings: Secondary | ICD-10-CM | POA: Diagnosis not present

## 2024-02-26 DIAGNOSIS — M545 Low back pain, unspecified: Secondary | ICD-10-CM | POA: Diagnosis not present

## 2024-02-26 DIAGNOSIS — Z113 Encounter for screening for infections with a predominantly sexual mode of transmission: Secondary | ICD-10-CM | POA: Diagnosis not present

## 2024-03-02 DIAGNOSIS — M545 Low back pain, unspecified: Secondary | ICD-10-CM | POA: Diagnosis not present

## 2024-03-02 DIAGNOSIS — M549 Dorsalgia, unspecified: Secondary | ICD-10-CM | POA: Diagnosis not present

## 2024-03-02 DIAGNOSIS — M5126 Other intervertebral disc displacement, lumbar region: Secondary | ICD-10-CM | POA: Diagnosis not present

## 2024-03-02 DIAGNOSIS — G8929 Other chronic pain: Secondary | ICD-10-CM | POA: Diagnosis not present
